# Patient Record
Sex: Female | Born: 1970 | Race: Black or African American | Hispanic: No | Marital: Single | Smoking: Current every day smoker
Health system: Southern US, Community
[De-identification: ages and names within clinical notes are randomized; demographics above are authoritative.]

## PROBLEM LIST (undated history)

## (undated) DIAGNOSIS — I10 Essential (primary) hypertension: Secondary | ICD-10-CM

## (undated) HISTORY — PX: FINGER SURGERY: SHX640

---

## 2002-11-09 ENCOUNTER — Emergency Department (HOSPITAL_COMMUNITY): Admission: EM | Admit: 2002-11-09 | Discharge: 2002-11-09 | Payer: Self-pay | Admitting: *Deleted

## 2006-10-21 ENCOUNTER — Emergency Department (HOSPITAL_COMMUNITY): Admission: EM | Admit: 2006-10-21 | Discharge: 2006-10-21 | Payer: Self-pay | Admitting: Emergency Medicine

## 2007-08-19 ENCOUNTER — Emergency Department (HOSPITAL_COMMUNITY): Admission: EM | Admit: 2007-08-19 | Discharge: 2007-08-19 | Payer: Self-pay | Admitting: Emergency Medicine

## 2007-08-21 ENCOUNTER — Emergency Department (HOSPITAL_COMMUNITY): Admission: EM | Admit: 2007-08-21 | Discharge: 2007-08-21 | Payer: Self-pay | Admitting: Emergency Medicine

## 2007-08-23 ENCOUNTER — Emergency Department (HOSPITAL_COMMUNITY): Admission: EM | Admit: 2007-08-23 | Discharge: 2007-08-23 | Payer: Self-pay | Admitting: Emergency Medicine

## 2007-12-12 ENCOUNTER — Emergency Department (HOSPITAL_COMMUNITY): Admission: EM | Admit: 2007-12-12 | Discharge: 2007-12-12 | Payer: Self-pay | Admitting: Emergency Medicine

## 2008-01-17 ENCOUNTER — Emergency Department (HOSPITAL_COMMUNITY): Admission: EM | Admit: 2008-01-17 | Discharge: 2008-01-17 | Payer: Self-pay | Admitting: Emergency Medicine

## 2008-02-03 ENCOUNTER — Emergency Department (HOSPITAL_COMMUNITY): Admission: EM | Admit: 2008-02-03 | Discharge: 2008-02-03 | Payer: Self-pay | Admitting: Emergency Medicine

## 2008-03-01 ENCOUNTER — Emergency Department (HOSPITAL_COMMUNITY): Admission: EM | Admit: 2008-03-01 | Discharge: 2008-03-01 | Payer: Self-pay | Admitting: Emergency Medicine

## 2008-03-26 ENCOUNTER — Emergency Department (HOSPITAL_COMMUNITY): Admission: EM | Admit: 2008-03-26 | Discharge: 2008-03-26 | Payer: Self-pay | Admitting: Emergency Medicine

## 2009-09-09 ENCOUNTER — Emergency Department (HOSPITAL_COMMUNITY): Admission: EM | Admit: 2009-09-09 | Discharge: 2009-09-09 | Payer: Self-pay | Admitting: Emergency Medicine

## 2009-10-25 ENCOUNTER — Emergency Department (HOSPITAL_COMMUNITY): Admission: EM | Admit: 2009-10-25 | Discharge: 2009-10-25 | Payer: Self-pay | Admitting: Gastroenterology

## 2010-03-21 ENCOUNTER — Emergency Department (HOSPITAL_COMMUNITY): Admission: EM | Admit: 2010-03-21 | Discharge: 2010-03-21 | Payer: Self-pay | Admitting: Emergency Medicine

## 2010-04-17 ENCOUNTER — Emergency Department (HOSPITAL_COMMUNITY): Admission: EM | Admit: 2010-04-17 | Discharge: 2010-04-17 | Payer: Self-pay | Admitting: Emergency Medicine

## 2010-08-26 ENCOUNTER — Emergency Department (HOSPITAL_COMMUNITY)
Admission: EM | Admit: 2010-08-26 | Discharge: 2010-08-26 | Disposition: A | Discharge status: Home / Self Care | Payer: Self-pay | Attending: Emergency Medicine | Admitting: Emergency Medicine

## 2010-08-26 ENCOUNTER — Emergency Department (HOSPITAL_COMMUNITY): Payer: Self-pay

## 2010-08-26 DIAGNOSIS — M1710 Unilateral primary osteoarthritis, unspecified knee: Secondary | ICD-10-CM | POA: Insufficient documentation

## 2010-08-26 DIAGNOSIS — M7660 Achilles tendinitis, unspecified leg: Secondary | ICD-10-CM | POA: Insufficient documentation

## 2010-08-26 DIAGNOSIS — IMO0002 Reserved for concepts with insufficient information to code with codable children: Secondary | ICD-10-CM | POA: Insufficient documentation

## 2010-09-04 ENCOUNTER — Emergency Department (HOSPITAL_COMMUNITY)
Admission: EM | Admit: 2010-09-04 | Discharge: 2010-09-04 | Disposition: A | Discharge status: Home / Self Care | Payer: Self-pay | Attending: Emergency Medicine | Admitting: Emergency Medicine

## 2010-09-04 ENCOUNTER — Emergency Department (HOSPITAL_COMMUNITY): Payer: Self-pay

## 2010-09-04 DIAGNOSIS — X503XXA Overexertion from repetitive movements, initial encounter: Secondary | ICD-10-CM | POA: Insufficient documentation

## 2010-09-04 DIAGNOSIS — Y92009 Unspecified place in unspecified non-institutional (private) residence as the place of occurrence of the external cause: Secondary | ICD-10-CM | POA: Insufficient documentation

## 2010-09-04 DIAGNOSIS — M25519 Pain in unspecified shoulder: Secondary | ICD-10-CM | POA: Insufficient documentation

## 2010-09-04 DIAGNOSIS — Y93H2 Activity, gardening and landscaping: Secondary | ICD-10-CM | POA: Insufficient documentation

## 2010-09-04 DIAGNOSIS — S46909A Unspecified injury of unspecified muscle, fascia and tendon at shoulder and upper arm level, unspecified arm, initial encounter: Secondary | ICD-10-CM | POA: Insufficient documentation

## 2010-09-04 DIAGNOSIS — S4980XA Other specified injuries of shoulder and upper arm, unspecified arm, initial encounter: Secondary | ICD-10-CM | POA: Insufficient documentation

## 2010-11-17 ENCOUNTER — Emergency Department (HOSPITAL_COMMUNITY): Payer: Self-pay

## 2010-11-17 ENCOUNTER — Emergency Department (HOSPITAL_COMMUNITY)
Admission: EM | Admit: 2010-11-17 | Discharge: 2010-11-17 | Disposition: A | Discharge status: Home / Self Care | Payer: Self-pay | Attending: Emergency Medicine | Admitting: Emergency Medicine

## 2010-11-17 DIAGNOSIS — W19XXXA Unspecified fall, initial encounter: Secondary | ICD-10-CM | POA: Insufficient documentation

## 2010-11-17 DIAGNOSIS — S93409A Sprain of unspecified ligament of unspecified ankle, initial encounter: Secondary | ICD-10-CM | POA: Insufficient documentation

## 2010-11-17 DIAGNOSIS — G43909 Migraine, unspecified, not intractable, without status migrainosus: Secondary | ICD-10-CM | POA: Insufficient documentation

## 2011-01-29 ENCOUNTER — Emergency Department (HOSPITAL_COMMUNITY): Payer: Self-pay

## 2011-01-29 ENCOUNTER — Emergency Department (HOSPITAL_COMMUNITY)
Admission: EM | Admit: 2011-01-29 | Discharge: 2011-01-29 | Disposition: A | Discharge status: Home / Self Care | Payer: Self-pay | Attending: Emergency Medicine | Admitting: Emergency Medicine

## 2011-01-29 ENCOUNTER — Encounter: Payer: Self-pay | Admitting: *Deleted

## 2011-01-29 DIAGNOSIS — F17200 Nicotine dependence, unspecified, uncomplicated: Secondary | ICD-10-CM | POA: Insufficient documentation

## 2011-01-29 DIAGNOSIS — Y92009 Unspecified place in unspecified non-institutional (private) residence as the place of occurrence of the external cause: Secondary | ICD-10-CM | POA: Insufficient documentation

## 2011-01-29 DIAGNOSIS — W108XXA Fall (on) (from) other stairs and steps, initial encounter: Secondary | ICD-10-CM | POA: Insufficient documentation

## 2011-01-29 DIAGNOSIS — S93409A Sprain of unspecified ligament of unspecified ankle, initial encounter: Secondary | ICD-10-CM | POA: Insufficient documentation

## 2011-01-29 MED ORDER — IBUPROFEN 800 MG PO TABS
ORAL_TABLET | ORAL | Status: AC
  Administered 2011-01-29: 800 mg via ORAL
  Filled 2011-01-29: qty 1

## 2011-01-29 MED ORDER — ACETAMINOPHEN-CODEINE #3 300-30 MG PO TABS: 1.0000 | ORAL_TABLET | Freq: Four times a day (QID) | ORAL | Status: AC | PRN

## 2011-01-29 NOTE — ED Notes (Signed)
C/o left ankle pain and swelling after slipping on steps today-states twisted ankle and felt pop; mild swelling noted to left ankle laterally; +3 left pedal pulsed palpated; in no distress; awaiting EDPA assessment

## 2011-01-29 NOTE — ED Notes (Signed)
ASO applied to left ankle; ice pack given; instructed to wear splint for compression and support, and to elevate as much as possible and apply ice pack at home to help alleviate swelling/pain.

## 2011-01-29 NOTE — ED Notes (Signed)
Pt states left ankle pain after a fall down steps ~ 1 hour PTA. Swelling to ankle.

## 2011-01-29 NOTE — Progress Notes (Signed)
Plan discussed. Crutches offered. Pt declined offer at this time.

## 2011-01-29 NOTE — ED Provider Notes (Signed)
History     Chief Complaint  Patient presents with  . Ankle Pain   Patient is a 40 y.o. female presenting with ankle pain. The history is provided by the patient.  Ankle Pain  The incident occurred 3 to 5 hours ago. The incident occurred at home. The injury mechanism was a fall. The pain is present in the left ankle. The quality of the pain is described as throbbing. The pain is moderate. The pain has been constant since onset. Associated symptoms include inability to bear weight. Pertinent negatives include no numbness. She reports no foreign bodies present. The symptoms are aggravated by bearing weight. She has tried nothing for the symptoms.    History reviewed. No pertinent past medical history.  Past Surgical History  Procedure Date  . Finger surgery     No family history on file.  History  Substance Use Topics  . Smoking status: Current Everyday Smoker  . Smokeless tobacco: Not on file  . Alcohol Use: Yes     OCC    OB History    Grav Para Term Preterm Abortions TAB SAB Ect Mult Living                  Review of Systems  Constitutional: Negative for activity change.       All ROS Neg except as noted in HPI  HENT: Negative for nosebleeds and neck pain.   Eyes: Negative for photophobia and discharge.  Respiratory: Negative for cough, shortness of breath and wheezing.   Cardiovascular: Negative for chest pain and palpitations.  Gastrointestinal: Negative for abdominal pain and blood in stool.  Genitourinary: Negative for dysuria, frequency and hematuria.  Musculoskeletal: Negative for back pain and arthralgias.  Skin: Negative.   Neurological: Negative for dizziness, seizures, speech difficulty and numbness.  Psychiatric/Behavioral: Negative for hallucinations and confusion.    Physical Exam  BP 128/90  Pulse 82  Temp(Src) 97.4 F (36.3 C) (Oral)  Resp 18  Ht 5\' 3"  (1.6 m)  Wt 170 lb (77.111 kg)  BMI 30.11 kg/m2  SpO2 100%  LMP 01/22/2011  Physical  Exam  Nursing note and vitals reviewed. Constitutional: She is oriented to person, place, and time. She appears well-developed and well-nourished.  Non-toxic appearance.  HENT:  Head: Normocephalic.  Right Ear: Tympanic membrane and external ear normal.  Left Ear: Tympanic membrane and external ear normal.  Eyes: EOM and lids are normal. Pupils are equal, round, and reactive to light.  Neck: Normal range of motion. Neck supple. Carotid bruit is not present.  Cardiovascular: Normal rate, regular rhythm, normal heart sounds, intact distal pulses and normal pulses.   Pulmonary/Chest: Breath sounds normal. No respiratory distress.  Abdominal: Soft. Bowel sounds are normal. There is no tenderness. There is no guarding.  Musculoskeletal: She exhibits tenderness.       Lt medial malleolus pain. Minimal swelling noted. DP pulse symmetrical. Achilles tendon intact.  Lymphadenopathy:       Head (right side): No submandibular adenopathy present.       Head (left side): No submandibular adenopathy present.    She has no cervical adenopathy.  Neurological: She is alert and oriented to person, place, and time. She has normal strength. No cranial nerve deficit or sensory deficit.  Skin: Skin is warm and dry.  Psychiatric: She has a normal mood and affect. Her speech is normal.    ED Course  Procedures  MDM I have reviewed nursing notes, vital signs, and all appropriate lab  and imaging results for this patient.      Kathie Dike, [place] 01/29/11 1610

## 2011-01-29 NOTE — Progress Notes (Signed)
  Medical screening examination/treatment/procedure(s) were performed by non-physician practitioner and as supervising physician I was immediately available for consultation/collaboration.     

## 2011-01-29 NOTE — ED Provider Notes (Signed)
Medical screening examination/treatment/procedure(s) were performed by non-physician practitioner and as supervising physician I was immediately available for consultation/collaboration.   Benny Lennert, MD 01/29/11 [phone]

## 2011-10-31 ENCOUNTER — Encounter (HOSPITAL_COMMUNITY): Payer: Self-pay | Admitting: *Deleted

## 2011-10-31 ENCOUNTER — Emergency Department (HOSPITAL_COMMUNITY): Payer: Self-pay

## 2011-10-31 ENCOUNTER — Emergency Department (HOSPITAL_COMMUNITY)
Admission: EM | Admit: 2011-10-31 | Discharge: 2011-10-31 | Disposition: A | Discharge status: Home / Self Care | Payer: Self-pay | Attending: Emergency Medicine | Admitting: Emergency Medicine

## 2011-10-31 DIAGNOSIS — M25519 Pain in unspecified shoulder: Secondary | ICD-10-CM | POA: Insufficient documentation

## 2011-10-31 DIAGNOSIS — S46319A Strain of muscle, fascia and tendon of triceps, unspecified arm, initial encounter: Secondary | ICD-10-CM

## 2011-10-31 DIAGNOSIS — X500XXA Overexertion from strenuous movement or load, initial encounter: Secondary | ICD-10-CM | POA: Insufficient documentation

## 2011-10-31 DIAGNOSIS — F17200 Nicotine dependence, unspecified, uncomplicated: Secondary | ICD-10-CM | POA: Insufficient documentation

## 2011-10-31 DIAGNOSIS — S43499A Other sprain of unspecified shoulder joint, initial encounter: Secondary | ICD-10-CM | POA: Insufficient documentation

## 2011-10-31 MED ORDER — ONDANSETRON HCL 4 MG PO TABS
4.0000 mg | ORAL_TABLET | Freq: Once | ORAL | Status: AC
  Administered 2011-10-31: 4 mg via ORAL
  Filled 2011-10-31: qty 1

## 2011-10-31 MED ORDER — IBUPROFEN 800 MG PO TABS: 800.0000 mg | ORAL_TABLET | Freq: Three times a day (TID) | ORAL | Status: AC

## 2011-10-31 MED ORDER — OXYCODONE-ACETAMINOPHEN 5-325 MG PO TABS
1.0000 | ORAL_TABLET | Freq: Once | ORAL | Status: AC
  Administered 2011-10-31: 1 via ORAL
  Filled 2011-10-31: qty 1

## 2011-10-31 MED ORDER — OXYCODONE-ACETAMINOPHEN 5-325 MG PO TABS: 1.0000 | ORAL_TABLET | Freq: Four times a day (QID) | ORAL | Status: AC | PRN

## 2011-10-31 MED ORDER — IBUPROFEN 800 MG PO TABS
800.0000 mg | ORAL_TABLET | Freq: Once | ORAL | Status: AC
  Administered 2011-10-31: 800 mg via ORAL
  Filled 2011-10-31: qty 1

## 2011-10-31 NOTE — Discharge Instructions (Signed)
Your have a strain of the muscles of the deltoid area. Please rest the right arm as much as possible. Ibuprofen three times daily with food. If pain continues after 7 to 8 days, please see Dr Romeo Apple for orthopedic evaluation. Use percocet for more severe pain. This may cause drowsiness and or constipation, use with caution.Muscle Strain A muscle strain (pulled muscle) happens when a muscle is over-stretched. Recovery usually takes 5 to 6 weeks.  HOME CARE   Put ice on the injured area.   Put ice in a plastic bag.   Place a towel between your skin and the bag.   Leave the ice on for 15 to 20 minutes at a time, every hour for the first 2 days.   Do not use the muscle for several days or until your doctor says you can. Do not use the muscle if you have pain.   Wrap the injured area with an elastic bandage for comfort. Do not put it on too tightly.   Only take medicine as told by your doctor.   Warm up before exercise. This helps prevent muscle strains.  GET HELP RIGHT AWAY IF:  There is increased pain or puffiness (swelling) in the affected area. MAKE SURE YOU:   Understand these instructions.   Will watch your condition.   Will get help right away if you are not doing well or get worse.  Document Released: 03/30/2008 Document Revised: 06/10/2011 Document Reviewed: 03/30/2008 [employer] Patient Information 2012 [place], [place].

## 2011-10-31 NOTE — ED Notes (Signed)
Pt a/ox4. Resp even and unlabored. NAD at this time. D/ C instructions reviewed with pt. Pt verbalized understanding. Pt ambulated to d/c desk with steady gate.  

## 2011-10-31 NOTE — ED Notes (Signed)
Pt presents with pain to left shoulder after lifting family member up. Pt has full ROM but is painful to move. Sensation is intact. Pulse palp. Skin WDI. NAD at this time. Will continue to monitor.

## 2011-10-31 NOTE — ED Notes (Signed)
Shoulder Immobilizer applied to right arm.

## 2011-10-31 NOTE — ED Provider Notes (Signed)
History     CSN: [id number]  Arrival date & time 10/31/11  1210   First MD Initiated Contact with Patient 10/31/11 1231      Chief Complaint  Patient presents with  . Shoulder Pain    (Consider location/radiation/quality/duration/timing/severity/associated sxs/prior treatment) Patient is a 41 y.o. female presenting with shoulder pain. The history is provided by the patient.  Shoulder Pain This is a new problem. The current episode started in the past 7 days. The problem occurs daily. The problem has been gradually worsening. Pertinent negatives include no abdominal pain, arthralgias, chest pain, coughing or neck pain.    History reviewed. No pertinent past medical history.  Past Surgical History  Procedure Date  . Finger surgery     No family history on file.  History  Substance Use Topics  . Smoking status: Current Everyday Smoker  . Smokeless tobacco: Not on file  . Alcohol Use: Yes     OCC    OB History    Grav Para Term Preterm Abortions TAB SAB Ect Mult Living                  Review of Systems  Constitutional: Negative for activity change.       All ROS Neg except as noted in HPI  HENT: Negative for nosebleeds and neck pain.   Eyes: Negative for photophobia and discharge.  Respiratory: Negative for cough, shortness of breath and wheezing.   Cardiovascular: Negative for chest pain and palpitations.  Gastrointestinal: Negative for abdominal pain and blood in stool.  Genitourinary: Negative for dysuria, frequency and hematuria.  Musculoskeletal: Negative for back pain and arthralgias.  Skin: Negative.   Neurological: Negative for dizziness, seizures and speech difficulty.  Psychiatric/Behavioral: Negative for hallucinations and confusion.    Allergies  Penicillins and Vancomycin  Home Medications   Current Outpatient Rx  Name Route Sig Dispense Refill  . IBUPROFEN 200 MG PO TABS Oral Take 400 mg by mouth every 6 (six) hours as needed. For headache      . IBUPROFEN 800 MG PO TABS Oral Take 1 tablet (800 mg total) by mouth 3 (three) times daily. 21 tablet 0    Take with food  . OXYCODONE-ACETAMINOPHEN 5-325 MG PO TABS Oral Take 1 tablet by mouth every 6 (six) hours as needed for pain. 20 tablet 0    BP 124/81  Pulse 98  Temp(Src) 98.1 F (36.7 C) (Oral)  Resp 16  Ht 5\' 3"  (1.6 m)  Wt 175 lb (79.379 kg)  BMI 31.00 kg/m2  SpO2 100%  LMP 10/14/2011  Physical Exam  Nursing note and vitals reviewed. Constitutional: She is oriented to person, place, and time. She appears well-developed and well-nourished.  Non-toxic appearance.  HENT:  Head: Normocephalic.  Right Ear: Tympanic membrane and external ear normal.  Left Ear: Tympanic membrane and external ear normal.  Eyes: EOM and lids are normal. Pupils are equal, round, and reactive to light.  Neck: Normal range of motion. Neck supple. Carotid bruit is not present.  Cardiovascular: Normal rate, regular rhythm, normal heart sounds, intact distal pulses and normal pulses.   Pulmonary/Chest: Breath sounds normal. No respiratory distress.  Abdominal: Soft. Bowel sounds are normal. There is no tenderness. There is no guarding.  Musculoskeletal: Normal range of motion.       Pain with attempted ROM of the left deltoid area. Not hot. No evidence for shoulder dislocation. Distal pulse and sensory wnl.  Lymphadenopathy:  Head (right side): No submandibular adenopathy present.       Head (left side): No submandibular adenopathy present.    She has no cervical adenopathy.  Neurological: She is alert and oriented to person, place, and time. She has normal strength. No cranial nerve deficit or sensory deficit.  Skin: Skin is warm and dry.  Psychiatric: She has a normal mood and affect. Her speech is normal.    ED Course  Procedures (including critical care time)  Labs Reviewed - No data to display Dg Shoulder Right  10/31/2011  *RADIOLOGY REPORT*  Clinical Data: Lifting injury with  right shoulder pain.  RIGHT SHOULDER - 2+ VIEW  Comparison: None.  Findings: No acute fracture identified.  There is some relative elevation of the distal clavicle compared to the acromion which may be new or old and reflective of AC joint sprain/separation. Acromial spur present inferolaterally.  No bony lesions or destruction.  IMPRESSION: Elevated distal clavicle relative to the acromion which may reflect new or old AC joint injury.  Acromial spur.  Original Report Authenticated By: Reola Calkins, M.D.     1. Strain of triceps muscle       MDM  I have reviewed nursing notes, vital signs, and all appropriate lab and imaging results for this patient. Pt was helping to lift a family member and injured the left shoulder nearly 1 week ago. xrays are negative for fx or dislocation. Pt referred to orthopedics. RX for ibuprofen and percocet given.       Kathie Dike, [place] 11/03/11 [phone]

## 2011-10-31 NOTE — ED Notes (Signed)
Pain to left shoulder x 1 week after lifting a family member.

## 2011-11-03 NOTE — ED Provider Notes (Signed)
Medical screening examination/treatment/procedure(s) were performed by non-physician practitioner and as supervising physician I was immediately available for consultation/collaboration.   Carleene Cooper III, MD 11/03/11 2010

## 2012-03-17 ENCOUNTER — Emergency Department (HOSPITAL_COMMUNITY): Payer: Self-pay

## 2012-03-17 ENCOUNTER — Encounter (HOSPITAL_COMMUNITY): Payer: Self-pay | Admitting: *Deleted

## 2012-03-17 ENCOUNTER — Emergency Department (HOSPITAL_COMMUNITY)
Admission: EM | Admit: 2012-03-17 | Discharge: 2012-03-17 | Disposition: A | Discharge status: Home / Self Care | Payer: Self-pay | Attending: Emergency Medicine | Admitting: Emergency Medicine

## 2012-03-17 DIAGNOSIS — M7660 Achilles tendinitis, unspecified leg: Secondary | ICD-10-CM | POA: Insufficient documentation

## 2012-03-17 DIAGNOSIS — F17200 Nicotine dependence, unspecified, uncomplicated: Secondary | ICD-10-CM | POA: Insufficient documentation

## 2012-03-17 MED ORDER — HYDROCODONE-ACETAMINOPHEN 5-325 MG PO TABS: 1.0000 | ORAL_TABLET | Freq: Four times a day (QID) | ORAL | Status: AC | PRN

## 2012-03-17 NOTE — ED Notes (Signed)
Pain lt post foot and radiates up.  No injury

## 2012-03-17 NOTE — ED Notes (Signed)
Pt reports left foot pain for several days denies any known injury.  Has had xray and been seen by pa --[employer]

## 2012-03-17 NOTE — ED Provider Notes (Signed)
History     CSN: [id number]  Arrival date & time 03/17/12  1457   First MD Initiated Contact with Patient 03/17/12 1756      Chief Complaint  Patient presents with  . Foot Pain    (Consider location/radiation/quality/duration/timing/severity/associated sxs/prior treatment) HPI Comments: Pt has no known injury.  She localizes the pain to her L heel and achilles tendon.  Pain with walking.  States she is unable to stand on her tiptoes.  No pre-existing problem with foot.  Patient is a 41 y.o. female presenting with lower extremity pain. The history is provided by the patient. No language interpreter was used.  Foot Pain This is a new problem. Episode onset: 3 days ago. The problem occurs constantly. The problem has been unchanged. Pertinent negatives include no chills or fever. The symptoms are aggravated by walking and standing. She has tried NSAIDs for the symptoms. The treatment provided no relief.    History reviewed. No pertinent past medical history.  Past Surgical History  Procedure Date  . Finger surgery     History reviewed. No pertinent family history.  History  Substance Use Topics  . Smoking status: Current Every Day Smoker  . Smokeless tobacco: Not on file  . Alcohol Use: Yes     OCC    OB History    Grav Para Term Preterm Abortions TAB SAB Ect Mult Living                  Review of Systems  Constitutional: Negative for fever and chills.  Musculoskeletal:       Heel pain     Allergies  Penicillins and Vancomycin  Home Medications   Current Outpatient Rx  Name Route Sig Dispense Refill  . HYDROCODONE-ACETAMINOPHEN 5-325 MG PO TABS Oral Take 1 tablet by mouth every 6 (six) hours as needed for pain. 20 tablet 0  . IBUPROFEN 200 MG PO TABS Oral Take 400 mg by mouth every 6 (six) hours as needed. For headache       BP 130/90  Pulse 78  Temp 98.7 F (37.1 C) (Oral)  Resp 18  Ht 5\' 3"  (1.6 m)  Wt 170 lb (77.111 kg)  BMI 30.11 kg/m2  LMP  02/13/2012  Physical Exam  Nursing note and vitals reviewed. Constitutional: She is oriented to person, place, and time. She appears well-developed and well-nourished. No distress.  HENT:  Head: Normocephalic and atraumatic.  Eyes: EOM are normal.  Neck: Normal range of motion.  Cardiovascular: Normal rate, regular rhythm and normal heart sounds.   Pulmonary/Chest: Effort normal and breath sounds normal.  Abdominal: Soft. She exhibits no distension. There is no tenderness.  Musculoskeletal: She exhibits tenderness.       Left ankle: She exhibits decreased range of motion. She exhibits no swelling, no ecchymosis, no deformity, no laceration and normal pulse. tenderness.       Feet:  Neurological: She is alert and oriented to person, place, and time.  Skin: Skin is warm and dry.  Psychiatric: She has a normal mood and affect. Judgment normal.    ED Course  Procedures (including critical care time)  Labs Reviewed - No data to display Dg Foot Complete Left  03/17/2012  *RADIOLOGY REPORT*  Clinical Data: Pain  LEFT FOOT - COMPLETE 3+ VIEW  Comparison: None.  Findings: There is no evidence of acute fracture.  The forefoot appears normal.  There is midfoot degenerative change with prominent osteophytes at the talonavicular articulation and the navicular  cuneiform articulations.  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  No acute finding at the heel.  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   Original Report Authenticated By: Thomasenia Sales, M.D.      1. Achilles tendonitis       MDM  Posterior splint in plantar flexion, crutches and non-wt bearing. Ice Ibuprofen rx-hydrocodone, 20 F/u with dr. Hilda Lias.        Evalina Field, [place] 03/17/12 1819

## 2012-03-18 NOTE — ED Provider Notes (Signed)
Medical screening examination/treatment/procedure(s) were performed by non-physician practitioner and as supervising physician I was immediately available for consultation/collaboration.  [name], MD 03/18/12 1456 

## 2012-05-12 IMAGING — CR DG SHOULDER 2+V*R*
3 series · 3 of 3 positions shown · non-contrast
Comparison: None.

CLINICAL DATA: Lifting injury with right shoulder pain.

RIGHT SHOULDER - 2+ VIEW

[view not recorded (1 of 3)]
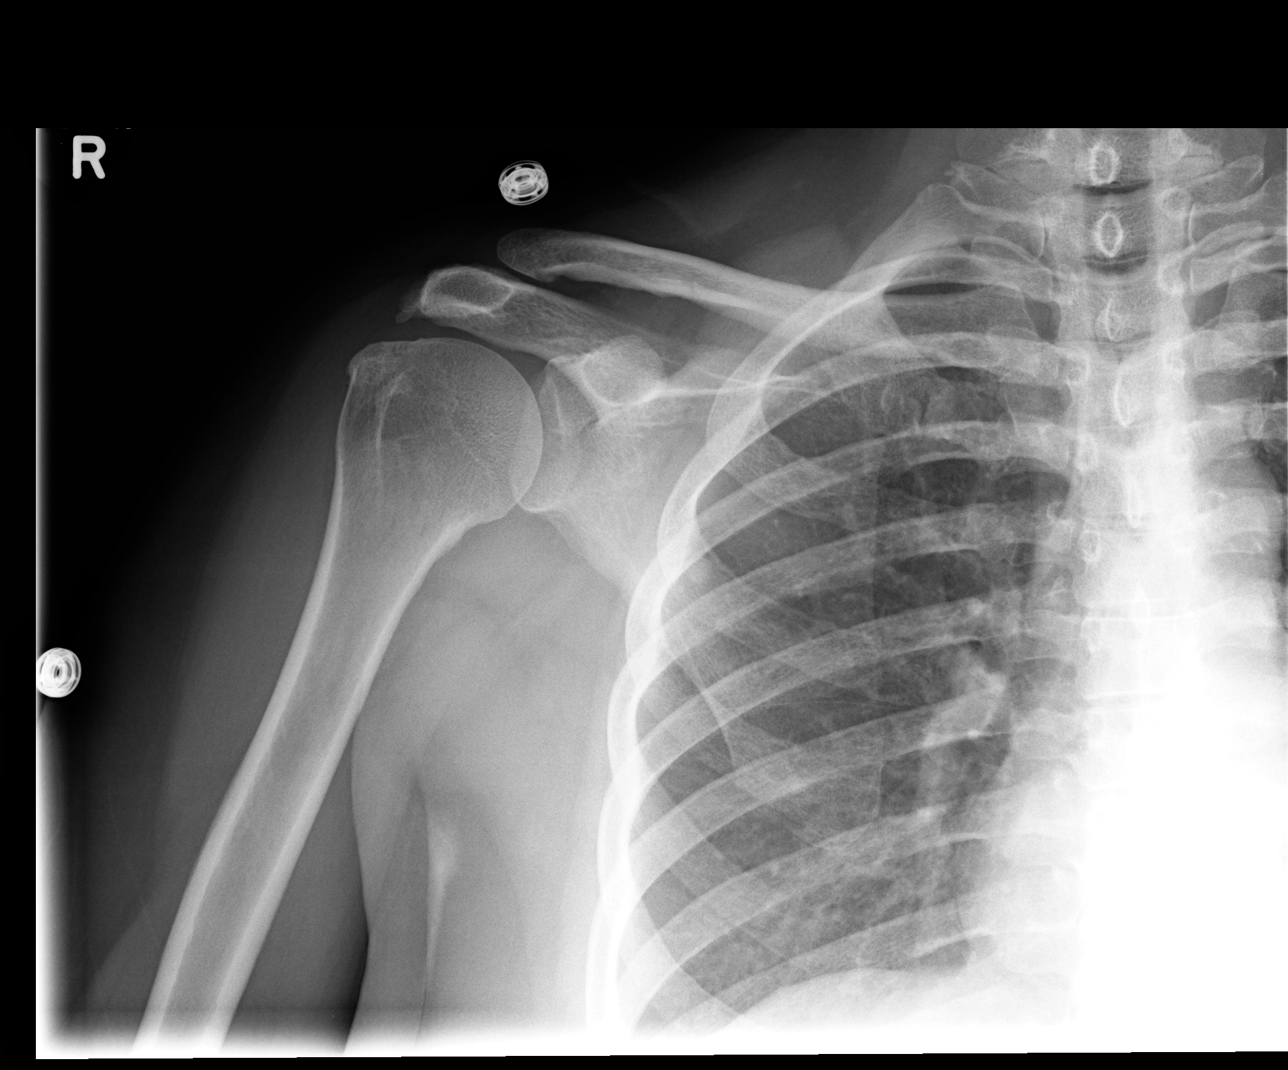

[view not recorded (2 of 3)]
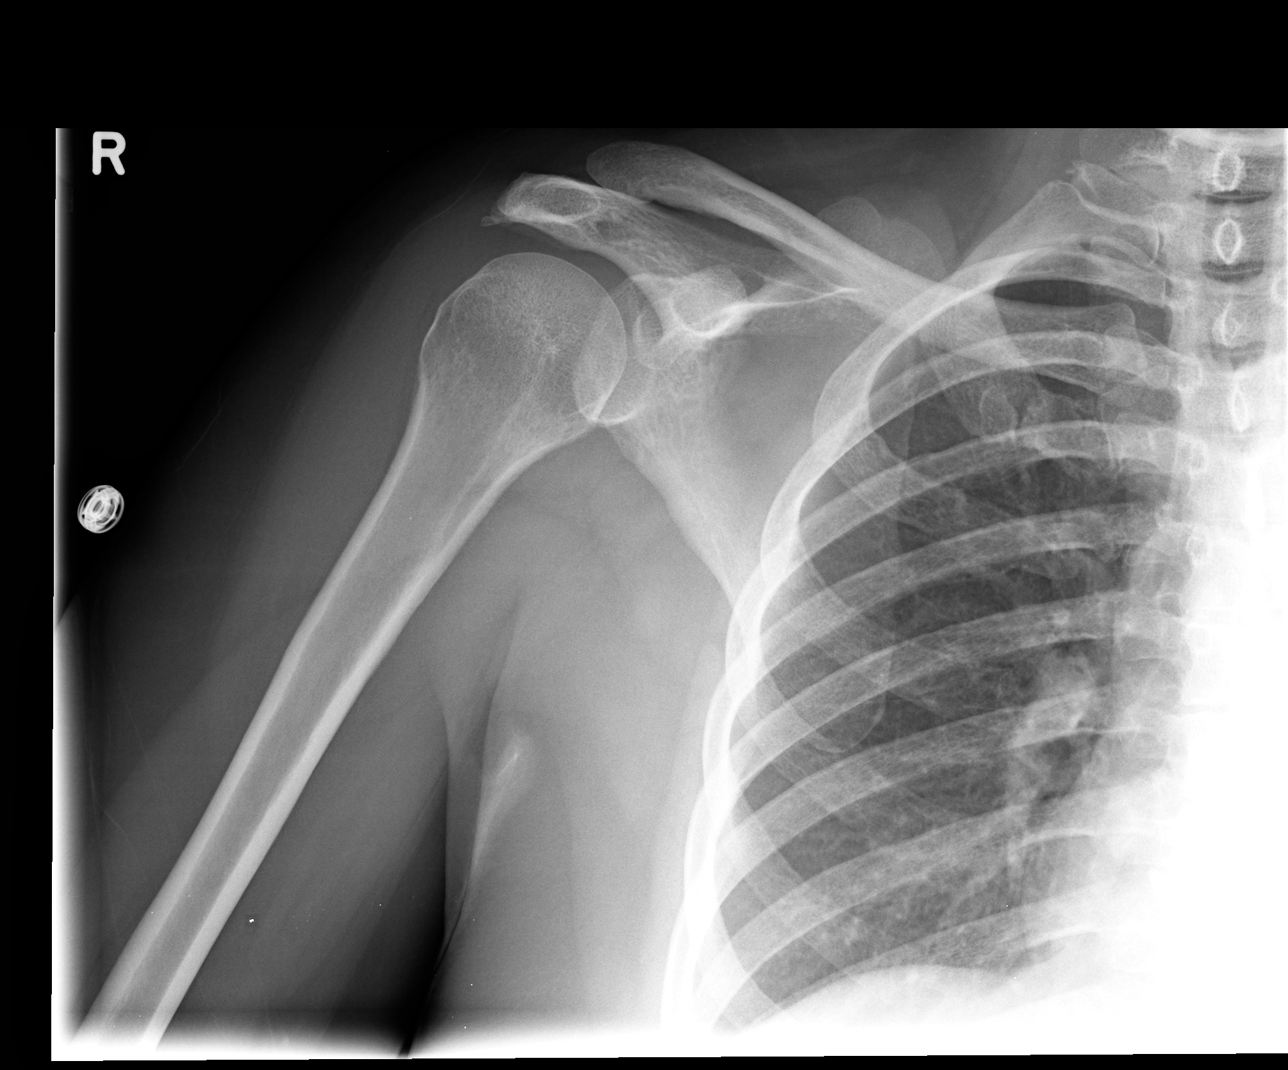

[view not recorded (3 of 3)]
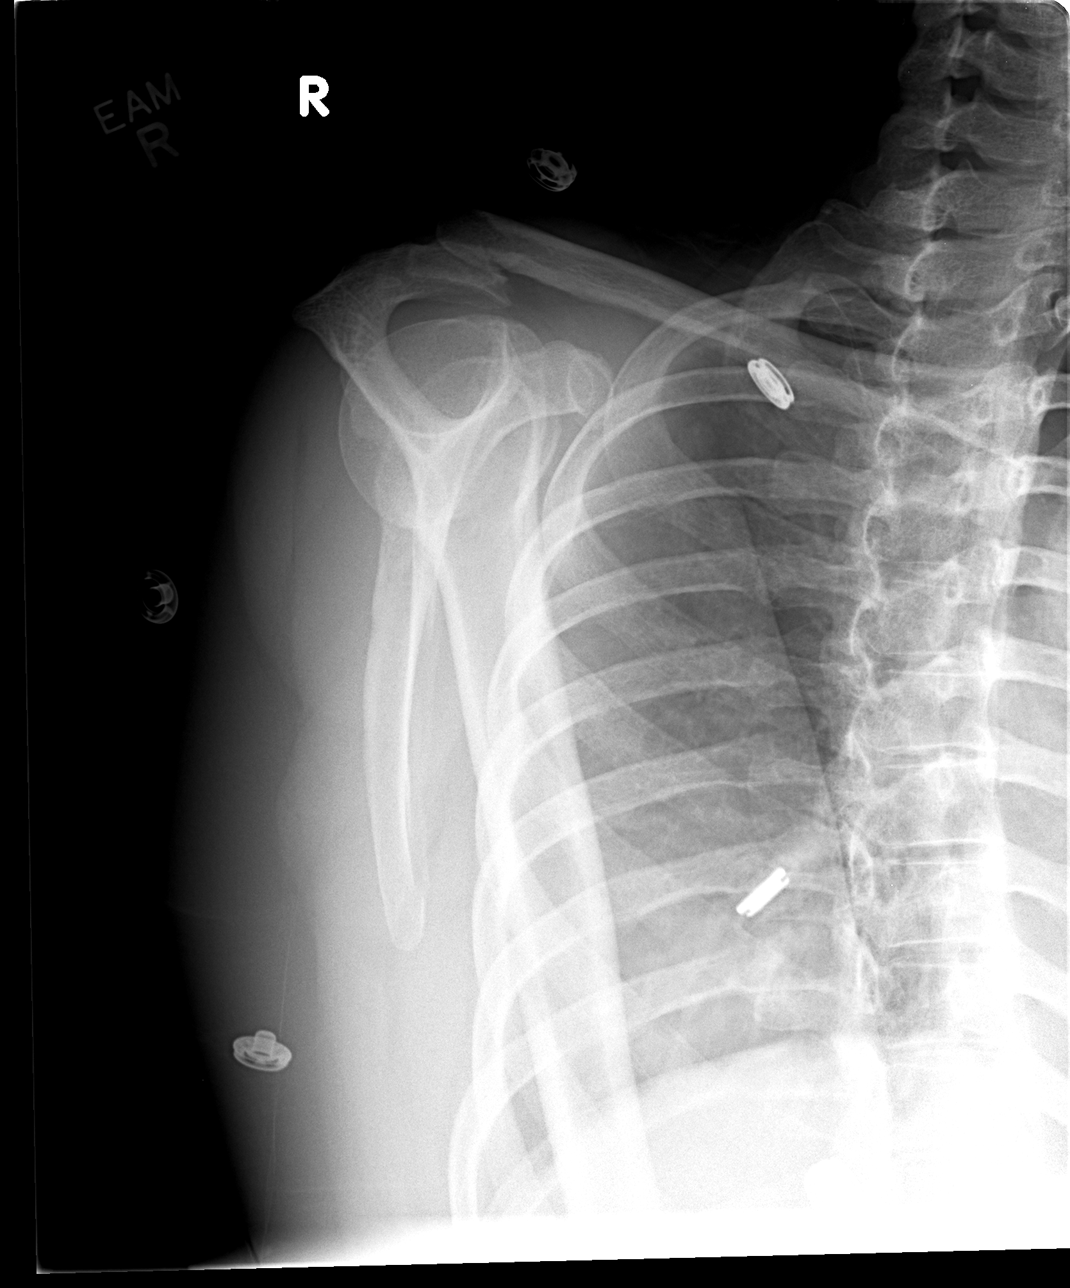

[3 of 3 positions shown; findings below may reference images not displayed]

FINDINGS: No acute fracture identified.  There is some relative
elevation of the distal clavicle compared to the acromion which may
be new or old and reflective of AC joint sprain/separation.
Acromial spur present inferolaterally.  No bony lesions or
destruction.
IMPRESSION: Elevated distal clavicle relative to the acromion which may reflect
new or old AC joint injury.  Acromial spur.

## 2012-05-25 ENCOUNTER — Emergency Department (HOSPITAL_COMMUNITY)
Admission: EM | Admit: 2012-05-25 | Discharge: 2012-05-25 | Disposition: A | Discharge status: Home / Self Care | Payer: Self-pay | Attending: Emergency Medicine | Admitting: Emergency Medicine

## 2012-05-25 ENCOUNTER — Encounter (HOSPITAL_COMMUNITY): Payer: Self-pay | Admitting: *Deleted

## 2012-05-25 DIAGNOSIS — Y9389 Activity, other specified: Secondary | ICD-10-CM | POA: Insufficient documentation

## 2012-05-25 DIAGNOSIS — M549 Dorsalgia, unspecified: Secondary | ICD-10-CM

## 2012-05-25 DIAGNOSIS — Y929 Unspecified place or not applicable: Secondary | ICD-10-CM | POA: Insufficient documentation

## 2012-05-25 DIAGNOSIS — F17200 Nicotine dependence, unspecified, uncomplicated: Secondary | ICD-10-CM | POA: Insufficient documentation

## 2012-05-25 DIAGNOSIS — I10 Essential (primary) hypertension: Secondary | ICD-10-CM | POA: Insufficient documentation

## 2012-05-25 DIAGNOSIS — IMO0002 Reserved for concepts with insufficient information to code with codable children: Secondary | ICD-10-CM | POA: Insufficient documentation

## 2012-05-25 DIAGNOSIS — S3981XA Other specified injuries of abdomen, initial encounter: Secondary | ICD-10-CM | POA: Insufficient documentation

## 2012-05-25 DIAGNOSIS — Z3202 Encounter for pregnancy test, result negative: Secondary | ICD-10-CM | POA: Insufficient documentation

## 2012-05-25 DIAGNOSIS — X503XXA Overexertion from repetitive movements, initial encounter: Secondary | ICD-10-CM | POA: Insufficient documentation

## 2012-05-25 HISTORY — DX: Essential (primary) hypertension: I10

## 2012-05-25 LAB — URINALYSIS, ROUTINE W REFLEX MICROSCOPIC
Bilirubin Urine: NEGATIVE
Glucose, UA: NEGATIVE mg/dL
Ketones, ur: NEGATIVE mg/dL
Protein, ur: NEGATIVE mg/dL
pH: 6 (ref 5.0–8.0)

## 2012-05-25 MED ORDER — OXYCODONE-ACETAMINOPHEN 5-325 MG PO TABS: 1.0000 | ORAL_TABLET | Freq: Three times a day (TID) | ORAL | Status: DC | PRN

## 2012-05-25 NOTE — ED Notes (Signed)
L Flank pain 10/10, radiating down L side to pelvis x 3 days which began after lifting boxes.  Pain had insidious onset and has been progressive.  She has been taking NSAIDs and Tylenol w/out relief.  Unable to sleep d/t pain.   No c/o dysuria, hematuria, SOB. Pain escalates w/L spine rotation and w/light palpation.

## 2012-05-25 NOTE — ED Notes (Signed)
Pt with left flank pain x 3 days, denies blood in urine

## 2012-05-25 NOTE — ED Provider Notes (Signed)
History   This chart was scribed for Joya Gaskins, MD by Gerlean Ren, ED Scribe. This patient was seen in room [facility] and the patient's care was started at 11:47 AM    CSN: [id number]  Arrival date & time 05/25/12  1100   First MD Initiated Contact with Patient 05/25/12 1123      Chief Complaint  Patient presents with  . Flank Pain     The history is provided by the patient. No language interpreter was used.   Mikayla Johnson is a 41 y.o. female who presents to the Emergency Department complaining of constant, non-radiating, aching left flank pain that is not improved by OTC with gradual onset 3 days ago after moving furniture.  Pt denies hematuria, dysuria, frequency, urgency, fever, emesis, diarrhea, abdominal pain, cough, dyspnea, chest pain, vaginal symptoms, vaginal bleeding, weakness or numbness in legs, or falls as associated.  Pt has h/o HTN.  Pt is a current everyday smoker and reports occasional alcohol use.  Past Medical History  Diagnosis Date  . Hypertension     Past Surgical History  Procedure Date  . Finger surgery     History reviewed. No pertinent family history.  History  Substance Use Topics  . Smoking status: Current Every Day Smoker  . Smokeless tobacco: Not on file  . Alcohol Use: Yes     Comment: OCC    No OB history provided.  Review of Systems  Constitutional: Negative for fever.  Respiratory: Negative for cough and shortness of breath.   Cardiovascular: Negative for chest pain.  Gastrointestinal: Negative for nausea, vomiting, abdominal pain and diarrhea.  Genitourinary: Positive for flank pain. Negative for dysuria, urgency, frequency, hematuria, vaginal bleeding, vaginal discharge and vaginal pain.  Neurological: Negative for weakness and numbness.  All other systems reviewed and are negative.    Allergies  Penicillins and Vancomycin  Home Medications   Current Outpatient Rx  Name  Route  Sig  Dispense  Refill  .  ACETAMINOPHEN 500 MG PO TABS   Oral   Take 1,000 mg by mouth every 6 (six) hours as needed. Pain.         . IBUPROFEN 200 MG PO TABS   Oral   Take 400 mg by mouth every 6 (six) hours as needed. For headache          . NAPROXEN SODIUM 220 MG PO TABS   Oral   Take 440 mg by mouth 2 (two) times daily as needed. Pain.           BP 137/88  Pulse 90  Temp 98 F (36.7 C) (Oral)  Resp 20  Ht 5\' 3"  (1.6 m)  Wt 170 lb (77.111 kg)  BMI 30.11 kg/m2  SpO2 100%  LMP 04/28/2012  Physical Exam CONSTITUTIONAL: Well developed/well nourished HEAD AND FACE: Normocephalic/atraumatic EYES: EOMI/PERRL ENMT: Mucous membranes moist NECK: supple no meningeal signs SPINE:entire spine nontender, No bruising/crepitance/stepoffs noted to spine CV: S1/S2 noted, no murmurs/rubs/gallops noted LUNGS: Lungs are clear to auscultation bilaterally, no apparent distress ABDOMEN: soft, nontender, no rebound or guarding MS: tenderness over left flank GU:no cva tenderness NEURO: Pt is awake/alert, moves all extremitiesx4, no focal motor deficits EXTREMITIES: pulses normal, full ROM SKIN: warm, color normal PSYCH: no abnormalities of mood noted  ED Course  Procedures  DIAGNOSTIC STUDIES: Oxygen Saturation is 100% on room air, normal by my interpretation.    COORDINATION OF CARE: 11:51 AM- Patient informed of clinical course, understands medical decision-making process,  and agrees with plan.  Ordered percocet at discharge.    Results for orders placed during the hospital encounter of 05/25/12  URINALYSIS, ROUTINE W REFLEX MICROSCOPIC      Component Value Range   Color, Urine YELLOW  YELLOW   APPearance CLEAR  CLEAR   Specific Gravity, Urine >1.030 (*) 1.005 - 1.030   pH 6.0  5.0 - 8.0   Glucose, UA NEGATIVE  NEGATIVE mg/dL   Hgb urine dipstick MODERATE (*) NEGATIVE   Bilirubin Urine NEGATIVE  NEGATIVE   Ketones, ur NEGATIVE  NEGATIVE mg/dL   Protein, ur NEGATIVE  NEGATIVE mg/dL    Urobilinogen, UA 0.2  0.0 - 1.0 mg/dL   Nitrite NEGATIVE  NEGATIVE   Leukocytes, UA NEGATIVE  NEGATIVE  POCT PREGNANCY, URINE      Component Value Range   Preg Test, Ur NEGATIVE  NEGATIVE  URINE MICROSCOPIC-ADD ON      Component Value Range   RBC / HPF 3-6  <3 RBC/hpf   Pt did have hematuria, but store more c/w MSK pain rather than ureterolithiasis.   Stable for d/c.  She is aware of hematuria, could have small kidney stone but pain likely due to recent moving furniture   MDM  Nursing notes including past medical history and social history reviewed and considered in documentation Labs/vital reviewed and considered    I personally performed the services described in this documentation, which was scribed in my presence. The recorded information has been reviewed and is accurate.           Joya Gaskins, MD 05/25/12 [phone]

## 2013-09-24 ENCOUNTER — Emergency Department (HOSPITAL_COMMUNITY)
Admission: EM | Admit: 2013-09-24 | Discharge: 2013-09-24 | Disposition: A | Discharge status: Home / Self Care | Payer: Self-pay | Attending: Emergency Medicine | Admitting: Emergency Medicine

## 2013-09-24 ENCOUNTER — Encounter (HOSPITAL_COMMUNITY): Payer: Self-pay | Admitting: Emergency Medicine

## 2013-09-24 ENCOUNTER — Emergency Department (HOSPITAL_COMMUNITY): Payer: Self-pay

## 2013-09-24 DIAGNOSIS — I10 Essential (primary) hypertension: Secondary | ICD-10-CM | POA: Insufficient documentation

## 2013-09-24 DIAGNOSIS — R079 Chest pain, unspecified: Secondary | ICD-10-CM | POA: Insufficient documentation

## 2013-09-24 DIAGNOSIS — F17200 Nicotine dependence, unspecified, uncomplicated: Secondary | ICD-10-CM | POA: Insufficient documentation

## 2013-09-24 DIAGNOSIS — Z880 Allergy status to penicillin: Secondary | ICD-10-CM | POA: Insufficient documentation

## 2013-09-24 LAB — CBC WITH DIFFERENTIAL/PLATELET
Basophils Absolute: 0 K/uL (ref 0.0–0.1)
Basophils Relative: 1 % (ref 0–1)
Eosinophils Absolute: 0.1 K/uL (ref 0.0–0.7)
Eosinophils Relative: 1 % (ref 0–5)
HCT: 45.4 % (ref 36.0–46.0)
Hemoglobin: 15.3 g/dL — ABNORMAL HIGH (ref 12.0–15.0)
Lymphocytes Relative: 28 % (ref 12–46)
Lymphs Abs: 2.3 K/uL (ref 0.7–4.0)
MCH: 32.9 pg (ref 26.0–34.0)
MCHC: 33.7 g/dL (ref 30.0–36.0)
MCV: 97.6 fL (ref 78.0–100.0)
Monocytes Absolute: 0.5 K/uL (ref 0.1–1.0)
Monocytes Relative: 7 % (ref 3–12)
Neutro Abs: 5.2 K/uL (ref 1.7–7.7)
Neutrophils Relative %: 64 % (ref 43–77)
Platelets: 285 K/uL (ref 150–400)
RBC: 4.65 MIL/uL (ref 3.87–5.11)
RDW: 14 % (ref 11.5–15.5)
WBC: 8.2 K/uL (ref 4.0–10.5)

## 2013-09-24 LAB — BASIC METABOLIC PANEL WITH GFR
BUN: 12 mg/dL (ref 6–23)
CO2: 25 meq/L (ref 19–32)
Calcium: 9.1 mg/dL (ref 8.4–10.5)
Chloride: 102 meq/L (ref 96–112)
Creatinine, Ser: 0.79 mg/dL (ref 0.50–1.10)
GFR calc Af Amer: >90 mL/min
GFR calc non Af Amer: >90 mL/min
Glucose, Bld: 99 mg/dL (ref 70–99)
Potassium: 3.9 meq/L (ref 3.7–5.3)
Sodium: 138 meq/L (ref 137–147)

## 2013-09-24 LAB — TROPONIN I: Troponin I: <0.3 ng/mL

## 2013-09-24 NOTE — ED Notes (Signed)
Pt c/o intermittent cp/left side pain worse today.

## 2013-09-24 NOTE — Discharge Instructions (Signed)
Chest Pain (Nonspecific) °It is often hard to give a specific diagnosis for the cause of chest pain. There is always a chance that your pain could be related to something serious, such as a heart attack or a blood clot in the lungs. You need to follow up with your caregiver for further evaluation. °CAUSES  °· Heartburn. °· Pneumonia or bronchitis. °· Anxiety or stress. °· Inflammation around your heart (pericarditis) or lung (pleuritis or pleurisy). °· A blood clot in the lung. °· A collapsed lung (pneumothorax). It can develop suddenly on its own (spontaneous pneumothorax) or from injury (trauma) to the chest. °· Shingles infection (herpes zoster virus). °The chest wall is composed of bones, muscles, and cartilage. Any of these can be the source of the pain. °· The bones can be bruised by injury. °· The muscles or cartilage can be strained by coughing or overwork. °· The cartilage can be affected by inflammation and become sore (costochondritis). °DIAGNOSIS  °Lab tests or other studies, such as X-rays, electrocardiography, stress testing, or cardiac imaging, may be needed to find the cause of your pain.  °TREATMENT  °· Treatment depends on what may be causing your chest pain. Treatment may include: °· Acid blockers for heartburn. °· Anti-inflammatory medicine. °· Pain medicine for inflammatory conditions. °· Antibiotics if an infection is present. °· You may be advised to change lifestyle habits. This includes stopping smoking and avoiding alcohol, caffeine, and chocolate. °· You may be advised to keep your head raised (elevated) when sleeping. This reduces the chance of acid going backward from your stomach into your esophagus. °· Most of the time, nonspecific chest pain will improve within 2 to 3 days with rest and mild pain medicine. °HOME CARE INSTRUCTIONS  °· If antibiotics were prescribed, take your antibiotics as directed. Finish them even if you start to feel better. °· For the next few days, avoid physical  activities that bring on chest pain. Continue physical activities as directed. °· Do not smoke. °· Avoid drinking alcohol. °· Only take over-the-counter or prescription medicine for pain, discomfort, or fever as directed by your caregiver. °· Follow your caregiver's suggestions for further testing if your chest pain does not go away. °· Keep any follow-up appointments you made. If you do not go to an appointment, you could develop lasting (chronic) problems with pain. If there is any problem keeping an appointment, you must call to reschedule. °SEEK MEDICAL CARE IF:  °· You think you are having problems from the medicine you are taking. Read your medicine instructions carefully. °· Your chest pain does not go away, even after treatment. °· You develop a rash with blisters on your chest. °SEEK IMMEDIATE MEDICAL CARE IF:  °· You have increased chest pain or pain that spreads to your arm, neck, jaw, back, or abdomen. °· You develop shortness of breath, an increasing cough, or you are coughing up blood. °· You have severe back or abdominal pain, feel nauseous, or vomit. °· You develop severe weakness, fainting, or chills. °· You have a fever. °THIS IS AN EMERGENCY. Do not wait to see if the pain will go away. Get medical help at once. Call your local emergency services (911 in [place] Do not drive yourself to the hospital. °MAKE SURE YOU:  °· Understand these instructions. °· Will watch your condition. °· Will get help right away if you are not doing well or get worse. °Document Released: 03/31/2005 Document Revised: 09/13/2011 Document Reviewed: 01/25/2008 °ExitCare® Patient Information ©2014 [employer],   LLC. ° ° ° °Emergency Department Resource Guide °1) Find a Doctor and Pay Out of Pocket °Although you won't have to find out who is covered by your insurance plan, it is a good idea to ask around and get recommendations. You will then need to call the office and see if the doctor you have chosen will accept you as a new  patient and what types of options they offer for patients who are self-pay. Some doctors offer discounts or will set up payment plans for their patients who do not have insurance, but you will need to ask so you aren't surprised when you get to your appointment. ° °2) Contact Your Local Health Department °Not all health departments have doctors that can see patients for sick visits, but many do, so it is worth a call to see if yours does. If you don't know where your local health department is, you can check in your phone book. The CDC also has a tool to help you locate your state's health department, and many state websites also have listings of all of their local health departments. ° °3) Find a Walk-in Clinic °If your illness is not likely to be very severe or complicated, you may want to try a walk in clinic. These are popping up all over the country in pharmacies, drugstores, and shopping centers. They're usually staffed by nurse practitioners or physician assistants that have been trained to treat common illnesses and complaints. They're usually fairly quick and inexpensive. However, if you have serious medical issues or chronic medical problems, these are probably not your best option. ° °No Primary Care Doctor: °- Call [facility] at  832-8000 - they can help you locate a primary care doctor that  accepts your insurance, provides certain services, etc. °- Physician Referral Service- 1-800-533-3463 ° °Chronic Pain Problems: °Organization         Address  Phone   Notes  °[facility]  (336) 297-2271 Patients need to be referred by their primary care doctor.  ° °Medication Assistance: °Organization         Address  Phone   Notes  °[place] [facility] [address] °[place], [place] [address] (336) 641-8030 --Must be a resident of [place] °-- Must have NO insurance coverage whatsoever (no Medicaid/ Medicare, etc.) °-- The pt. MUST have a primary  care doctor that directs their care regularly and follows them in the community °  °[facility]  (866) 331-1348   °[employer]  (888) 892-1162   ° °Agencies that provide inexpensive medical care: °Organization         Address  Phone   Notes  °[facility]  (336) 832-8035   °[facility]    (336) 832-7272   °[facility] [address] °[place], [place] [address] (336) 832-4777   °[facility] [address], °[place] (336) 271-4999   °[facility]    (336) 373-0678   °[facility]    (336) 272-1050   °[facility] ° [address], [place] Phone:  (336) 832-4444, Fax:  (336) 832-4440 Hours of Operation:  9 am - 6 pm, M-F.  Also accepts Medicaid/Medicare and self-pay.  °[facility] ° [address], [place] Phone: (336) 832-3150, Fax: (336) [phone]. Hours of Operation:  8:30 am - 5:30 pm, M-F.  Also accepts Medicaid and self-pay.  °  [facility] [place] [address], [place] Phone: (336) 878-6027   °[facility] [address], [place], [place] (336)723-1848, Ext. 123 Mondays & Thursdays: 7-9 AM.  First 15 patients are seen on a first come, first serve basis. °  ° °Medicaid-accepting [place] Providers: ° °Organization         Address  Phone   Notes  °[facility] [address], [place] (336) 641-2100 Also accepts self-pay patients.  °[facility] [address], [place] ° (336) 856-9996   °[facility] [address], [place] (336) 288-8857   °[facility] [address] [place] [address], [place] (336) 299-7000   °Veita Bland [address], [place]  ° (336) 373-1557 Only accepts [place] Access Medicaid patients after they have their name applied to their card.  ° °Self-Pay (no insurance) in [place]: ° °Organization         Address  Phone   Notes  °Sickle Cell Patients, [facility] [address], [place] (336) 832-1970   °[facility] [address], [place] (336) 832-4400   °[facility] ° [address], [place] (336) 992-4800   °[facility]/Dr. Osei-Bonsu ° [address] [place] [address], [place] or [address], [place] (336) 841-8500 Phone number for both [place] and [place] locations is the same.  °[facility] [address], [place] (336) 299-0000   °[facility] [address] [place] [address], [place] or [address] (336) 852-7530 °(336) 878-2260   °[facility] [address], [place] (336) 350-1642, phone; (336) 294-5005, fax Sees patients 1st and 3rd Saturday of every month.  Must not qualify for public or private insurance (i.e. Medicaid, Medicare, [place] Health Choice, Veterans' Benefits) • Household income should be no more than 200% of the poverty level •The clinic cannot treat you if you are pregnant or think you are pregnant • Sexually transmitted diseases are not treated at the clinic.  ° ° °Dental Care: °Organization         Address  Phone  Notes  °[facility] [address], [place] (336) 641-6152 Accepts children up to age 21 who are enrolled in Medicaid or [place] Health Choice; pregnant women with a Medicaid card; and children who have applied for Medicaid or [place] Health Choice, but were declined, whose parents can pay a reduced fee at time of service.  °[facility] [place]  [address], [place] (336) 641-7733 Accepts children up to age 21 who are enrolled in Medicaid or [place] Health Choice; pregnant women with a Medicaid card; and children who have applied for Medicaid or [place] Health Choice, but were declined, whose parents can  pay a reduced fee at time of service.  °[facility] ° [address], [place] (336) 641-4533 Patients are seen by appointment only. Walk-ins are not accepted. [facility] will see patients 18 years of age and older. °Monday - Tuesday (8am-5pm) °Most Wednesdays (8:30-5pm) °$30 per visit, cash only  °[facility] ° [address], [place] (336) 641-4533 Patients are seen by appointment only. Walk-ins are not accepted. [facility] will see patients 18 years of   age and older. °One Wednesday Evening (Monthly: Volunteer Based).  $30 per visit, cash only  °[facility]  (919) 537-3737 for adults; Children under age 4, call Graduate Pediatric Dentistry at (919) [phone]. Children aged 4-14, please call (919) [phone] to request a pediatric application. ° Dental services are provided in all areas of dental care including fillings, crowns and bridges, complete and partial dentures, implants, gum treatment, root canals, and extractions. Preventive care is also provided. Treatment is provided to both adults and children. °Patients are selected via a lottery and there is often a waiting list. °  °[facility] [address], °[place] ° (336) 763-8833 [email or web address] °  °[facility] [address], [place], [place] (336)723-1848, Ext. 123 Second and Fourth Thursday of each month, opens at 6:30 AM; Clinic ends at 9 AM.  Patients are seen on a first-come first-served basis, and a limited number are seen during each clinic.  ° °[facility] ° [address], [place], [place] (336) 723-7904   Eligibility Requirements °You must have lived in [place], [place], or [place] counties for at least the last three months. °  You cannot be eligible for state or federal sponsored healthcare insurance, including Veterans Administration, Medicaid, or Medicare. °  You generally cannot be eligible for healthcare  insurance through your employer.  °  How to apply: °Eligibility screenings are held every Tuesday and Wednesday afternoon from 1:00 pm until 4:00 pm. You do not need an appointment for the interview!  °[facility] [address], [place], [place] 336-631-2330   °[facility]  336-342-8273   °[facility]  336-703-3100   °[facility]  336-570-6415   ° °Behavioral Health Resources in the Community: °Intensive Outpatient Programs °Organization         Address  Phone  Notes  °[facility] [address], [place], [place] 336-878-6098   °[facility] [address], [place], [place] 336-832-9800   °[facility] [address], [place], [place] ° 336-882-2125   °[facility] [address],  °[place], [place] 1-800-853-5163 or 336-641-4981   °Substance Abuse Resources °Organization         Address  Phone  Notes  °[facility]  336-882-2125   °[facility]  336-784-9470   °[facility]  336-285-9073   °[facility]  336-845-3988   °Residential & Outpatient Substance Abuse Program  1-800-659-3381   °Psychological Services °Organization         Address  Phone  Notes  °[facility]  336- 832-9600   °[facility]  336- 378-7881   °[facility] [address], [place] 1-800-853-5163 or 336-641-4981   ° °Mobile Crisis Teams °Organization         Address  Phone  Notes  °[facility], Mobile Crisis Care Unit  1-877-626-1772   °Assertive °[facility] ° [address] [place], [place] 336-834-9664   °Sharon DeEsch [address] °[place] 336-554-5454   ° °Self-Help/Support Groups °Organization         Address  Phone             Notes  °[facility] - variety of support groups  336- 373-1402 Call for more information  °Narcotics Anonymous (NA),  [facility] [address], °[place]  2   meetings at this location  ° °Residential Treatment Programs °Organization         Address  Phone  Notes  °[facility] [address],    °[place]  1-866-801-8205   °[facility] ° [address], [place], [place] 704-293-8524   °[facility] [address], [place] 336-845-3988 Admissions: 8am-3pm M-F  °[facility] [address]    °[place], [place] 336-841-1104   °[facility] [address], [place], [place] 336-379-7146   °[facility] [address]  °[place], [place] 336-285-9073   °[facility] - Intensive Outpatient [address], [place], [place] 336-852-3033   °[facility] [address]  °[place], [place] 1-877-615-2722 or 336-784-9470   °[facility] [address] [place], [place] 336-227-7417 Accepts Medicaid  °[facility] [address]  °[place] 1-800-659-3381 Substance Abuse/Addiction Treatment  ° °[place] Behavioral Health Resources °Organization         Address  Phone  Notes  °[facility]  (888) 581-9988   °Julie Brannon, PhD [address] [place], [place]   (336) 349-5553 or (336) 951-0000   °[facility]   [address] °[place], [place] (336) 349-4454   °[facility] [address], [place], [place] (336) 342-8316 Insurance/Medicaid/sponsorship through [facility]  °[facility] [address]                                    [place], [place] (336) 342-8316 Therapy/tele-psych/case  °[facility] [address]  ° [place], [place] (336) 349-2233    °Dr. Arfeen  (336) 349-4544   °[facility] [place]  [employer] [place] [facility] 1) [address], [place] °2) [address], [place] °3)  [address], [place] (336) 349-3220 °(336) 342-7768 ° °(336) 342-8140    °[place] [facility] (336) 342-1394 or (336) 342-3537 (After Hours)    ° ° ° °

## 2013-09-24 NOTE — ED Notes (Signed)
nad noted prior to dc. dcinstructions and explained. Pt voiced understanding.

## 2013-09-24 NOTE — ED Provider Notes (Signed)
CSN: [id number]     Arrival date & time 09/24/13  1503 History  This chart was scribed for Raeford Razor, MD by Leone Payor, ED Scribe. This patient was seen in room APA18/APA18 and the patient's care was started 4:24 PM.     Chief Complaint  Patient presents with  . Chest Pain      The history is provided by the patient. No language interpreter was used.    HPI Comments: Mikayla Johnson is a 43 y.o. female who presents to the Emergency Department complaining of 2 days of intermittent, gradually worsening, non-radiating left sided chest pain that lasts for a few minutes at a time. She describes the pain as sharp and shooting. She denies any modifying factors. She has taken ibuprofen and Aleve without relief. She denies similar symptoms in the past. She denies nausea, vomiting, cough, palpitations, SOB, leg swelling, leg pain.   Past Medical History  Diagnosis Date  . Hypertension    Past Surgical History  Procedure Laterality Date  . Finger surgery     No family history on file. History  Substance Use Topics  . Smoking status: Current Every Day Smoker  . Smokeless tobacco: Not on file  . Alcohol Use: Yes     Comment: OCC   OB History   Grav Para Term Preterm Abortions TAB SAB Ect Mult Living                 Review of Systems  Respiratory: Negative for cough and shortness of breath.   Cardiovascular: Positive for chest pain. Negative for palpitations and leg swelling.  Gastrointestinal: Negative for nausea and vomiting.  All other systems reviewed and are negative.      Allergies  Penicillins and Vancomycin  Home Medications   Current Outpatient Rx  Name  Route  Sig  Dispense  Refill  . ibuprofen (ADVIL,MOTRIN) 200 MG tablet   Oral   Take 400 mg by mouth every 6 (six) hours as needed. For headache          . oxyCODONE-acetaminophen (PERCOCET/ROXICET) 5-325 MG per tablet   Oral   Take 1 tablet by mouth every 8 (eight) hours as needed for pain.   10  tablet   0    BP 143/85  Pulse 91  Temp(Src) 98.3 F (36.8 C) (Oral)  Resp 14  SpO2 98%  LMP 09/10/2013 Physical Exam  Nursing note and vitals reviewed. Constitutional: She is oriented to person, place, and time. She appears well-developed and well-nourished. No distress.  HENT:  Head: Normocephalic and atraumatic.  Eyes: EOM are normal.  Neck: Normal range of motion.  Cardiovascular: Normal rate, regular rhythm and normal heart sounds.   Pulmonary/Chest: Effort normal and breath sounds normal. She exhibits no tenderness.  Abdominal: Soft. She exhibits no distension. There is no tenderness.  Musculoskeletal: Normal range of motion.  No calf tenderness or edema.   Neurological: She is alert and oriented to person, place, and time.  Skin: Skin is warm and dry.  Psychiatric: She has a normal mood and affect. Judgment normal.    ED Course  Procedures (including critical care time)  DIAGNOSTIC STUDIES: Oxygen Saturation is 98% on RA, normal by my interpretation.    COORDINATION OF CARE: 4:25 PM Discussed treatment plan with pt at bedside and pt agreed to plan.   Labs Review Labs Reviewed  CBC WITH DIFFERENTIAL - Abnormal; Notable for the following:    Hemoglobin 15.3 (*)    All other  components within normal limits  BASIC METABOLIC PANEL  TROPONIN I   Imaging Review Dg Chest 2 View  09/24/2013   CLINICAL DATA:  Chest pain.  Hypertension, smoker.  EXAM: CHEST  2 VIEW  COMPARISON:  None.  FINDINGS: The heart size and mediastinal contours are within normal limits. Both lungs are clear. The visualized skeletal structures are unremarkable.  IMPRESSION: No active cardiopulmonary disease.   Electronically Signed   By: Charlett NoseKevin  Dover M.D.   On: 09/24/2013 15:51     EKG Interpretation   Date/Time:  Monday September 24 2013 15:29:55 EDT Ventricular Rate:  90 PR Interval:  146 QRS Duration: 70 QT Interval:  342 QTC Calculation: 418 R Axis:   62 Text Interpretation:  Normal sinus  rhythm Normal ECG No previous ECGs  available ED PHYSICIAN INTERPRETATION AVAILABLE IN [facility]  Confirmed by TEST, Record ([id number]) on 09/26/2013 6:54:00 AM      MDM   Final diagnoses:  Chest pain     I personally preformed the services scribed in my presence. The recorded information has been reviewed is accurate. Raeford RazorStephen Alexie Samson, MD.   Raeford RazorStephen Letty Salvi, MD 09/28/13 [phone]

## 2014-06-06 ENCOUNTER — Emergency Department (HOSPITAL_COMMUNITY): Payer: Self-pay

## 2014-06-06 ENCOUNTER — Encounter (HOSPITAL_COMMUNITY): Payer: Self-pay | Admitting: Emergency Medicine

## 2014-06-06 ENCOUNTER — Emergency Department (HOSPITAL_COMMUNITY)
Admission: EM | Admit: 2014-06-06 | Discharge: 2014-06-06 | Disposition: A | Discharge status: Home / Self Care | Payer: Self-pay | Attending: Emergency Medicine | Admitting: Emergency Medicine

## 2014-06-06 DIAGNOSIS — N760 Acute vaginitis: Secondary | ICD-10-CM | POA: Insufficient documentation

## 2014-06-06 DIAGNOSIS — Z720 Tobacco use: Secondary | ICD-10-CM | POA: Insufficient documentation

## 2014-06-06 DIAGNOSIS — N739 Female pelvic inflammatory disease, unspecified: Secondary | ICD-10-CM | POA: Insufficient documentation

## 2014-06-06 DIAGNOSIS — I10 Essential (primary) hypertension: Secondary | ICD-10-CM | POA: Insufficient documentation

## 2014-06-06 DIAGNOSIS — K5900 Constipation, unspecified: Secondary | ICD-10-CM | POA: Insufficient documentation

## 2014-06-06 DIAGNOSIS — R109 Unspecified abdominal pain: Secondary | ICD-10-CM

## 2014-06-06 DIAGNOSIS — R143 Flatulence: Secondary | ICD-10-CM | POA: Insufficient documentation

## 2014-06-06 DIAGNOSIS — B9689 Other specified bacterial agents as the cause of diseases classified elsewhere: Secondary | ICD-10-CM

## 2014-06-06 DIAGNOSIS — Z3202 Encounter for pregnancy test, result negative: Secondary | ICD-10-CM | POA: Insufficient documentation

## 2014-06-06 DIAGNOSIS — N730 Acute parametritis and pelvic cellulitis: Secondary | ICD-10-CM

## 2014-06-06 LAB — COMPREHENSIVE METABOLIC PANEL
ALT: 14 U/L (ref 0–35)
ANION GAP: 13 (ref 5–15)
AST: 17 U/L (ref 0–37)
Albumin: 3.6 g/dL (ref 3.5–5.2)
Alkaline Phosphatase: 72 U/L (ref 39–117)
BILIRUBIN TOTAL: 0.2 mg/dL — AB (ref 0.3–1.2)
BUN: 17 mg/dL (ref 6–23)
CHLORIDE: 104 meq/L (ref 96–112)
CO2: 21 meq/L (ref 19–32)
CREATININE: 0.88 mg/dL (ref 0.50–1.10)
Calcium: 8.7 mg/dL (ref 8.4–10.5)
GFR calc Af Amer: >90 mL/min (ref >90)
GFR, EST NON AFRICAN AMERICAN: 79 mL/min — AB (ref >90)
Glucose, Bld: 123 mg/dL — ABNORMAL HIGH (ref 70–99)
Potassium: 3.7 mEq/L (ref 3.7–5.3)
Sodium: 138 mEq/L (ref 137–147)
Total Protein: 7.3 g/dL (ref 6.0–8.3)

## 2014-06-06 LAB — URINALYSIS, ROUTINE W REFLEX MICROSCOPIC
BILIRUBIN URINE: NEGATIVE
Glucose, UA: NEGATIVE mg/dL
Ketones, ur: NEGATIVE mg/dL
LEUKOCYTES UA: NEGATIVE
Nitrite: NEGATIVE
PROTEIN: NEGATIVE mg/dL
SPECIFIC GRAVITY, URINE: 1.02 (ref 1.005–1.030)
Urobilinogen, UA: 0.2 mg/dL (ref 0.0–1.0)
pH: 6.5 (ref 5.0–8.0)

## 2014-06-06 LAB — CBC WITH DIFFERENTIAL/PLATELET
Basophils Absolute: 0 10*3/uL (ref 0.0–0.1)
Basophils Relative: 0 % (ref 0–1)
Eosinophils Absolute: 0.1 10*3/uL (ref 0.0–0.7)
Eosinophils Relative: 1 % (ref 0–5)
HEMATOCRIT: 42.2 % (ref 36.0–46.0)
HEMOGLOBIN: 14.4 g/dL (ref 12.0–15.0)
LYMPHS PCT: 37 % (ref 12–46)
Lymphs Abs: 3.6 10*3/uL (ref 0.7–4.0)
MCH: 32.6 pg (ref 26.0–34.0)
MCHC: 34.1 g/dL (ref 30.0–36.0)
MCV: 95.5 fL (ref 78.0–100.0)
MONO ABS: 0.5 10*3/uL (ref 0.1–1.0)
MONOS PCT: 5 % (ref 3–12)
NEUTROS ABS: 5.4 10*3/uL (ref 1.7–7.7)
Neutrophils Relative %: 57 % (ref 43–77)
Platelets: 276 10*3/uL (ref 150–400)
RBC: 4.42 MIL/uL (ref 3.87–5.11)
RDW: 13.9 % (ref 11.5–15.5)
WBC: 9.6 10*3/uL (ref 4.0–10.5)

## 2014-06-06 LAB — URINE MICROSCOPIC-ADD ON

## 2014-06-06 LAB — PREGNANCY, URINE: PREG TEST UR: NEGATIVE

## 2014-06-06 LAB — WET PREP, GENITAL
TRICH WET PREP: NONE SEEN
WBC WET PREP: NONE SEEN

## 2014-06-06 MED ORDER — ONDANSETRON HCL 4 MG/2ML IJ SOLN
INTRAMUSCULAR | Status: DC
Start: 2014-06-06 — End: 2014-06-08
  Filled 2014-06-06: qty 2

## 2014-06-06 MED ORDER — LIDOCAINE HCL (PF) 1 % IJ SOLN
INTRAMUSCULAR | Status: AC
  Filled 2014-06-06: qty 5

## 2014-06-06 MED ORDER — FLEET ENEMA 7-19 GM/118ML RE ENEM
1.0000 | ENEMA | Freq: Once | RECTAL | Status: AC
  Administered 2014-06-06: 1 via RECTAL

## 2014-06-06 MED ORDER — METRONIDAZOLE 500 MG PO TABS: 500.0000 mg | ORAL_TABLET | Freq: Two times a day (BID) | ORAL | Status: AC

## 2014-06-06 MED ORDER — NAPROXEN 500 MG PO TABS: 500.0000 mg | ORAL_TABLET | Freq: Two times a day (BID) | ORAL | Status: AC

## 2014-06-06 MED ORDER — CEFTRIAXONE SODIUM 250 MG IJ SOLR
250.0000 mg | Freq: Once | INTRAMUSCULAR | Status: AC
  Administered 2014-06-06: 250 mg via INTRAMUSCULAR

## 2014-06-06 MED ORDER — ONDANSETRON 4 MG PO TBDP
4.0000 mg | ORAL_TABLET | Freq: Once | ORAL | Status: AC
  Administered 2014-06-06: 4 mg via ORAL

## 2014-06-06 MED ORDER — ONDANSETRON HCL 4 MG/2ML IJ SOLN: 4.0000 mg | Freq: Once | INTRAMUSCULAR | Status: DC

## 2014-06-06 MED ORDER — DOXYCYCLINE HYCLATE 100 MG PO CAPS: 100.0000 mg | ORAL_CAPSULE | Freq: Two times a day (BID) | ORAL | Status: AC

## 2014-06-06 MED ORDER — HYDROMORPHONE HCL 1 MG/ML IJ SOLN: 1.0000 mg | Freq: Once | INTRAMUSCULAR | Status: DC

## 2014-06-06 MED ORDER — OXYCODONE-ACETAMINOPHEN 5-325 MG PO TABS
1.0000 | ORAL_TABLET | Freq: Once | ORAL | Status: AC
  Administered 2014-06-06: 1 via ORAL

## 2014-06-06 MED ORDER — AZITHROMYCIN 250 MG PO TABS
ORAL_TABLET | ORAL | Status: AC
  Filled 2014-06-06: qty 4

## 2014-06-06 MED ORDER — AZITHROMYCIN 250 MG PO TABS
1000.0000 mg | ORAL_TABLET | Freq: Once | ORAL | Status: AC
  Administered 2014-06-06: 1000 mg via ORAL

## 2014-06-06 MED ORDER — OXYCODONE-ACETAMINOPHEN 5-325 MG PO TABS
ORAL_TABLET | ORAL | Status: AC
  Filled 2014-06-06: qty 1

## 2014-06-06 MED ORDER — SODIUM CHLORIDE 0.9 % IV SOLN: INTRAVENOUS | Status: DC

## 2014-06-06 MED ORDER — HYDROMORPHONE HCL 1 MG/ML IJ SOLN
INTRAMUSCULAR | Status: AC
  Filled 2014-06-06: qty 1

## 2014-06-06 MED ORDER — CEFTRIAXONE SODIUM 250 MG IJ SOLR
INTRAMUSCULAR | Status: AC
  Filled 2014-06-06: qty 250

## 2014-06-06 MED ORDER — ONDANSETRON 4 MG PO TBDP
ORAL_TABLET | ORAL | Status: AC
  Filled 2014-06-06: qty 1

## 2014-06-06 NOTE — ED Provider Notes (Signed)
CSN: [id number]     Arrival date & time 06/06/14  1831 History   First MD Initiated Contact with Patient 06/06/14 1849     Chief Complaint  Patient presents with  . Abdominal Pain     (Consider location/radiation/quality/duration/timing/severity/associated sxs/prior Treatment) Patient is a 43 y.o. female presenting with abdominal pain. The history is provided by the patient.  Abdominal Pain Pain location:  LLQ, RLQ and suprapubic Pain quality: cramping and sharp   Pain radiates to:  Perineum Onset quality:  Gradual Duration:  1 day Timing:  Constant Progression:  Worsening Chronicity:  New Associated symptoms: constipation and flatus   Associated symptoms: no chest pain, no chills, no cough, no dysuria, no fever, no nausea, no shortness of breath, no vaginal bleeding, no vaginal discharge and no vomiting    Herbert Moorseressa R Rozycki is a 43 y.o. female who presents to the ED with constipation x 3 days and abdominal pain x 1 day. The pain radiates from the lower abdomen to the vaginal area. Patient states she will feel like she needs to have a BM but strains and can not get it out.   Past Medical History  Diagnosis Date  . Hypertension    Past Surgical History  Procedure Laterality Date  . Finger surgery     No family history on file. History  Substance Use Topics  . Smoking status: Current Every Day Smoker -- 0.50 packs/day    Types: Cigarettes  . Smokeless tobacco: Not on file  . Alcohol Use: Yes     Comment: OCC   OB History    No data available     Review of Systems  Constitutional: Negative for fever and chills.  HENT: Negative.   Eyes: Negative for pain, itching and visual disturbance.  Respiratory: Negative for cough, shortness of breath and wheezing.   Cardiovascular: Negative for chest pain.  Gastrointestinal: Positive for abdominal pain, constipation and flatus. Negative for nausea and vomiting.  Genitourinary: Negative for dysuria, urgency, frequency, vaginal  bleeding and vaginal discharge.  Musculoskeletal: Negative for myalgias and back pain.  Skin: Negative for rash.  Neurological: Negative for syncope, light-headedness and headaches.  Psychiatric/Behavioral: Negative for confusion. The patient is not nervous/anxious.       Allergies  Penicillins and Vancomycin  Home Medications   Prior to Admission medications   Medication Sig Start Date End Date Taking? Authorizing Provider  naproxen sodium (ALEVE) 220 MG tablet Take 220-440 mg by mouth daily as needed (for pain).   Yes Historical Provider, MD   BP 138/85 mmHg  Pulse 80  Temp(Src) 98.3 F (36.8 C) (Oral)  Resp 20  Ht 5\' 4"  (1.626 m)  Wt 180 lb (81.647 kg)  BMI 30.88 kg/m2  SpO2 100%  LMP 02/12/2014 Physical Exam  Constitutional: She is oriented to person, place, and time. She appears well-developed and well-nourished.  HENT:  Head: Normocephalic.  Eyes: EOM are normal.  Neck: Neck supple.  Cardiovascular: Normal rate.   Pulmonary/Chest: Effort normal.  Abdominal: Soft. There is tenderness in the right lower quadrant, suprapubic area and left lower quadrant. There is no rebound, no guarding and no CVA tenderness.  Increased pain LLQ  Genitourinary: Rectal exam shows no external hemorrhoid and anal tone normal. Guaiac negative stool.  External genitalia without lesions, white discharge vaginal vault. Positive CMT, bilateral adnexal tenderness left >right. Uterus without palpable enlargement.    Large amount of hard stool palpated on rectal exam.   Musculoskeletal: Normal range of motion.  Neurological: She is alert and oriented to person, place, and time. No cranial nerve deficit.  Skin: Skin is warm and dry.  Psychiatric: She has a normal mood and affect. Her behavior is normal.  Nursing note and vitals reviewed.   ED Course  Procedures (including critical care time) Labs Review Results for orders placed or performed during the hospital encounter of 06/06/14 (from  the past 24 hour(s))  Urinalysis, Routine w reflex microscopic     Status: Abnormal   Collection Time: 06/06/14  7:14 PM  Result Value Ref Range   Color, Urine YELLOW YELLOW   APPearance HAZY (A) CLEAR   Specific Gravity, Urine 1.020 1.005 - 1.030   pH 6.5 5.0 - 8.0   Glucose, UA NEGATIVE NEGATIVE mg/dL   Hgb urine dipstick SMALL (A) NEGATIVE   Bilirubin Urine NEGATIVE NEGATIVE   Ketones, ur NEGATIVE NEGATIVE mg/dL   Protein, ur NEGATIVE NEGATIVE mg/dL   Urobilinogen, UA 0.2 0.0 - 1.0 mg/dL   Nitrite NEGATIVE NEGATIVE   Leukocytes, UA NEGATIVE NEGATIVE  Urine microscopic-add on     Status: Abnormal   Collection Time: 06/06/14  7:14 PM  Result Value Ref Range   Squamous Epithelial / LPF FEW (A) RARE   WBC, UA 3-6 <3 WBC/hpf   RBC / HPF 3-6 <3 RBC/hpf   Bacteria, UA MANY (A) RARE  CBC with Differential     Status: None   Collection Time: 06/06/14  7:22 PM  Result Value Ref Range   WBC 9.6 4.0 - 10.5 K/uL   RBC 4.42 3.87 - 5.11 MIL/uL   Hemoglobin 14.4 12.0 - 15.0 g/dL   HCT 16.142.2 09.636.0 - 04.546.0 %   MCV 95.5 78.0 - 100.0 fL   MCH 32.6 26.0 - 34.0 pg   MCHC 34.1 30.0 - 36.0 g/dL   RDW 40.913.9 81.111.5 - 91.415.5 %   Platelets 276 150 - 400 K/uL   Neutrophils Relative % 57 43 - 77 %   Neutro Abs 5.4 1.7 - 7.7 K/uL   Lymphocytes Relative 37 12 - 46 %   Lymphs Abs 3.6 0.7 - 4.0 K/uL   Monocytes Relative 5 3 - 12 %   Monocytes Absolute 0.5 0.1 - 1.0 K/uL   Eosinophils Relative 1 0 - 5 %   Eosinophils Absolute 0.1 0.0 - 0.7 K/uL   Basophils Relative 0 0 - 1 %   Basophils Absolute 0.0 0.0 - 0.1 K/uL  Comprehensive metabolic panel     Status: Abnormal   Collection Time: 06/06/14  7:22 PM  Result Value Ref Range   Sodium 138 137 - 147 mEq/L   Potassium 3.7 3.7 - 5.3 mEq/L   Chloride 104 96 - 112 mEq/L   CO2 21 19 - 32 mEq/L   Glucose, Bld 123 (H) 70 - 99 mg/dL   BUN 17 6 - 23 mg/dL   Creatinine, Ser 7.820.88 0.50 - 1.10 mg/dL   Calcium 8.7 8.4 - 95.610.5 mg/dL   Total Protein 7.3 6.0 - 8.3 g/dL    Albumin 3.6 3.5 - 5.2 g/dL   AST 17 0 - 37 U/L   ALT 14 0 - 35 U/L   Alkaline Phosphatase 72 39 - 117 U/L   Total Bilirubin 0.2 (L) 0.3 - 1.2 mg/dL   GFR calc non Af Amer 79 (L) >90 mL/min   GFR calc Af Amer >90 >90 mL/min   Anion gap 13 5 - 15  Pregnancy, urine     Status: None  Collection Time: 06/06/14  7:55 PM  Result Value Ref Range   Preg Test, Ur NEGATIVE NEGATIVE  Wet prep, genital     Status: Abnormal   Collection Time: 06/06/14  7:57 PM  Result Value Ref Range   Yeast Wet Prep HPF POC FEW (A) NONE SEEN   Trich, Wet Prep NONE SEEN NONE SEEN   Clue Cells Wet Prep HPF POC MODERATE (A) NONE SEEN   WBC, Wet Prep HPF POC NONE SEEN NONE SEEN    Ct Renal Stone Study  06/06/2014   CLINICAL DATA:  Acute lower abdominal/ pelvic pain. Initial encounter.  EXAM: CT ABDOMEN AND PELVIS WITHOUT CONTRAST  TECHNIQUE: Multidetector CT imaging of the abdomen and pelvis was performed following the standard protocol without IV contrast.  COMPARISON:  None.  FINDINGS: The liver, gallbladder, spleen, pancreas, adrenal glands and kidneys are unremarkable.  Please note that parenchymal abnormalities may be missed without intravenous contrast.  There is no evidence of free fluid, biliary dilation or abdominal aortic aneurysm.  The bowel, bladder and appendix are unremarkable. There is no evidence of bowel obstruction, abscess or pneumoperitoneum.  A 1.1 x 2 cm soft tissue structure in the left presacral region (image 57) is indeterminate but may represent a lymph node.  An enlarged fluid filled tubular structure in the right adnexal region is compatible with fluid in a dilated fallopian tube and may represent hydrosalpinx or pyosalpinx -correlate clinically.  No acute or suspicious bony abnormalities are identified.  IMPRESSION: Right hydrosalpinx or pyosalpinx.  Correlate clinically.  1.1 x 2 cm soft tissue structure in the left presacral region, indeterminate but may represent a lymph node. Recommend CT  follow-up of the pelvis in 3 months to ensure stability.   Electronically Signed   By: Laveda Abbe M.D.   On: 06/06/2014 22:43    MDM  43 y.o. female with lower abdominal pain x 3 days. Will treat for PID and have her follow up with her PCP. I have reviewed this patient's vital signs, nurses notes, appropriate labs and imaging. I have discussed findings and plan of care with the patient and she voices understanding and agrees with plan.   Rocephin 250 mg IM, Zithromax 1 gram PO, follow up with Dr. Emelda Fear in 2 weeks.    Medication List    STOP taking these medications        ALEVE 220 MG tablet  Generic drug:  naproxen sodium      TAKE these medications        doxycycline 100 MG capsule  Commonly known as:  VIBRAMYCIN  Take 1 capsule (100 mg total) by mouth 2 (two) times daily.     metroNIDAZOLE 500 MG tablet  Commonly known as:  FLAGYL  Take 1 tablet (500 mg total) by mouth 2 (two) times daily.     naproxen 500 MG tablet  Commonly known as:  NAPROSYN  Take 1 tablet (500 mg total) by mouth 2 (two) times daily.           [address] [place], NP 06/09/14 Prentice Docker  Rolland Porter, MD 06/16/14 [phone]

## 2014-06-06 NOTE — ED Notes (Addendum)
PT c/o lower cramping abdominal pain today with constipation x3 days. PT denies any n/v/d. PT denies any urinary symptoms.

## 2014-06-06 NOTE — Discharge Instructions (Signed)
Call Dr. Rayna SextonFerguson's office for a follow up appointment. Return here as needed for worsening symptoms.

## 2014-06-07 LAB — POC OCCULT BLOOD, ED: FECAL OCCULT BLD: NEGATIVE

## 2014-06-08 LAB — GC/CHLAMYDIA PROBE AMP
CT Probe RNA: NEGATIVE
GC Probe RNA: NEGATIVE

## 2015-11-08 ENCOUNTER — Emergency Department (HOSPITAL_COMMUNITY): Payer: Self-pay

## 2015-11-08 ENCOUNTER — Encounter (HOSPITAL_COMMUNITY): Payer: Self-pay | Admitting: Emergency Medicine

## 2015-11-08 ENCOUNTER — Emergency Department (HOSPITAL_COMMUNITY)
Admission: EM | Admit: 2015-11-08 | Discharge: 2015-11-08 | Disposition: A | Discharge status: Home / Self Care | Payer: Self-pay | Attending: Emergency Medicine | Admitting: Emergency Medicine

## 2015-11-08 DIAGNOSIS — W208XXA Other cause of strike by thrown, projected or falling object, initial encounter: Secondary | ICD-10-CM | POA: Insufficient documentation

## 2015-11-08 DIAGNOSIS — S90121A Contusion of right lesser toe(s) without damage to nail, initial encounter: Secondary | ICD-10-CM

## 2015-11-08 DIAGNOSIS — Z791 Long term (current) use of non-steroidal anti-inflammatories (NSAID): Secondary | ICD-10-CM | POA: Insufficient documentation

## 2015-11-08 DIAGNOSIS — F17210 Nicotine dependence, cigarettes, uncomplicated: Secondary | ICD-10-CM | POA: Insufficient documentation

## 2015-11-08 DIAGNOSIS — I10 Essential (primary) hypertension: Secondary | ICD-10-CM | POA: Insufficient documentation

## 2015-11-08 DIAGNOSIS — L603 Nail dystrophy: Secondary | ICD-10-CM | POA: Insufficient documentation

## 2015-11-08 DIAGNOSIS — Y999 Unspecified external cause status: Secondary | ICD-10-CM | POA: Insufficient documentation

## 2015-11-08 DIAGNOSIS — Z792 Long term (current) use of antibiotics: Secondary | ICD-10-CM | POA: Insufficient documentation

## 2015-11-08 DIAGNOSIS — Y939 Activity, unspecified: Secondary | ICD-10-CM | POA: Insufficient documentation

## 2015-11-08 DIAGNOSIS — S90111A Contusion of right great toe without damage to nail, initial encounter: Secondary | ICD-10-CM | POA: Insufficient documentation

## 2015-11-08 DIAGNOSIS — Y929 Unspecified place or not applicable: Secondary | ICD-10-CM | POA: Insufficient documentation

## 2015-11-08 MED ORDER — HYDROCODONE-ACETAMINOPHEN 5-325 MG PO TABS: 2.0000 | ORAL_TABLET | ORAL | Status: AC | PRN

## 2015-11-08 MED ORDER — IBUPROFEN 600 MG PO TABS: 600.0000 mg | ORAL_TABLET | Freq: Four times a day (QID) | ORAL | Status: AC | PRN

## 2015-11-08 MED ORDER — IBUPROFEN 400 MG PO TABS: 600.0000 mg | ORAL_TABLET | Freq: Once | ORAL | Status: DC

## 2015-11-08 NOTE — ED Notes (Signed)
Pt states a wooden table fell on her right great toe last night.

## 2015-11-08 NOTE — ED Notes (Signed)
Pt verbalized understanding of no driving and to use caution within 4 hours of taking pain meds due to meds cause drowsiness 

## 2015-11-08 NOTE — ED Provider Notes (Signed)
CSN: [id number]     Arrival date & time 11/08/15  1140 History   First MD Initiated Contact with Patient 11/08/15 1201     Chief Complaint  Patient presents with  . Toe Pain     (Consider location/radiation/quality/duration/timing/severity/associated sxs/prior Treatment) Patient is a 45 y.o. female presenting with toe pain. The history is provided by the patient. No language interpreter was used.  Toe Pain This is a new problem. The current episode started yesterday. The problem occurs constantly. The problem has been unchanged. Associated symptoms include joint swelling. Nothing aggravates the symptoms. She has tried nothing for the symptoms. The treatment provided moderate relief.   Pt reports she dropped a tv table on her foot.   Past Medical History  Diagnosis Date  . Hypertension    Past Surgical History  Procedure Laterality Date  . Finger surgery     History reviewed. No pertinent family history. Social History  Substance Use Topics  . Smoking status: Current Every Day Smoker -- 0.50 packs/day    Types: Cigarettes  . Smokeless tobacco: None  . Alcohol Use: Yes     Comment: OCC   OB History    No data available     Review of Systems  Musculoskeletal: Positive for joint swelling.  All other systems reviewed and are negative.     Allergies  Penicillins and Vancomycin  Home Medications   Prior to Admission medications   Medication Sig Start Date End Date Taking? Authorizing Provider  doxycycline (VIBRAMYCIN) 100 MG capsule Take 1 capsule (100 mg total) by mouth 2 (two) times daily. 06/06/14   Hope Orlene OchM Neese, NP  metroNIDAZOLE (FLAGYL) 500 MG tablet Take 1 tablet (500 mg total) by mouth 2 (two) times daily. 06/06/14   Hope Orlene OchM Neese, NP  naproxen (NAPROSYN) 500 MG tablet Take 1 tablet (500 mg total) by mouth 2 (two) times daily. 06/06/14   Hope Orlene OchM Neese, NP   BP 132/88 mmHg  Pulse 102  Temp(Src) 98.4 F (36.9 C) (Temporal)  Resp 16  Ht 5\' 3"  (1.6 m)  Wt 79.379 kg   BMI 31.01 kg/m2  SpO2 100%  LMP 10/28/2015 Physical Exam  Constitutional: She is oriented to person, place, and time. She appears well-developed and well-nourished.  HENT:  Head: Normocephalic.  Eyes: Conjunctivae and EOM are normal. Pupils are equal, round, and reactive to light.  Abdominal: She exhibits no distension.  Musculoskeletal: She exhibits tenderness.  Tender right 1st toe,  Nail discolored normal cap refill,  Nail is thick,  (pt had previous injury)  nv intact  Neurological: She is alert and oriented to person, place, and time.  Skin: Skin is warm.  Psychiatric: She has a normal mood and affect.  Nursing note and vitals reviewed.   ED Course  Procedures (including critical care time) Labs Review Labs Reviewed - No data to display  Imaging Review Dg Toe Great Right  11/08/2015  CLINICAL DATA:  Table fell on right great toe last night. EXAM: RIGHT GREAT TOE COMPARISON:  08/26/2010 FINDINGS: Negative for a fracture or dislocation. Alignment of the great toe is normal. Soft tissues are unremarkable. IMPRESSION: No acute abnormality. Electronically Signed   By: Richarda OverlieAdam  Henn M.D.   On: 11/08/2015 12:12   I have personally reviewed and evaluated these images and lab results as part of my medical decision-making.   EKG Interpretation None      MDM    Final diagnoses:  Contusion, toe, right, initial encounter  Dystrophic nail  Meds ordered this encounter  Medications  . ibuprofen (ADVIL,MOTRIN) tablet 600 mg    Sig:   . HYDROcodone-acetaminophen (NORCO/VICODIN) 5-325 MG tablet    Sig: Take 2 tablets by mouth every 4 (four) hours as needed.    Dispense:  10 tablet    Refill:  0    Order Specific Question:  Supervising Provider    Answer:  Hyacinth Meeker, BRIAN [[id number]]  . ibuprofen (ADVIL,MOTRIN) 600 MG tablet    Sig: Take 1 tablet (600 mg total) by mouth every 6 (six) hours as needed.    Dispense:  12 tablet    Refill:  0    Order Specific Question:  Supervising  Provider    Answer:  Eber Hong [[id number]]  An After Visit Summary was printed and given to the patient.    Lonia Skinner Dahlgren, PA-C 11/08/15 1420  Loren Racer, MD 11/12/15 1710

## 2015-11-08 NOTE — Discharge Instructions (Signed)

## 2021-03-16 ENCOUNTER — Encounter (HOSPITAL_COMMUNITY): Payer: Self-pay

## 2021-03-16 ENCOUNTER — Emergency Department (HOSPITAL_COMMUNITY)
Admission: EM | Admit: 2021-03-16 | Discharge: 2021-03-16 | Disposition: A | Discharge status: Home / Self Care | Payer: Self-pay | Attending: Emergency Medicine | Admitting: Emergency Medicine

## 2021-03-16 ENCOUNTER — Other Ambulatory Visit: Payer: Self-pay

## 2021-03-16 ENCOUNTER — Emergency Department (HOSPITAL_COMMUNITY): Payer: Self-pay

## 2021-03-16 DIAGNOSIS — I10 Essential (primary) hypertension: Secondary | ICD-10-CM | POA: Insufficient documentation

## 2021-03-16 DIAGNOSIS — F17210 Nicotine dependence, cigarettes, uncomplicated: Secondary | ICD-10-CM | POA: Insufficient documentation

## 2021-03-16 DIAGNOSIS — M25511 Pain in right shoulder: Secondary | ICD-10-CM | POA: Insufficient documentation

## 2021-03-16 DIAGNOSIS — X500XXA Overexertion from strenuous movement or load, initial encounter: Secondary | ICD-10-CM | POA: Insufficient documentation

## 2021-03-16 DIAGNOSIS — Y990 Civilian activity done for income or pay: Secondary | ICD-10-CM | POA: Insufficient documentation

## 2021-03-16 MED ORDER — CYCLOBENZAPRINE HCL 10 MG PO TABS: 10.0000 mg | ORAL_TABLET | Freq: Two times a day (BID) | ORAL | 0 refills | Status: AC | PRN

## 2021-03-16 NOTE — ED Provider Notes (Signed)
[facility] EMERGENCY DEPARTMENT Provider Note   CSN: [id number] Arrival date & time: 03/16/21  1537     History Chief Complaint  Patient presents with   Shoulder Pain    Mikayla Johnson is a 50 y.o. female.  HPI  Patient with no significant medical history presents to the emergency department with chief complaint of right shoulder pain.  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  She states she feels as if she strained a muscle in her shoulder.  The pain is in the top aspect of her deltoid, does not radiate, not associated with paresthesia or weakness in her arm, states still able to move her fingers wrist and elbow without difficulty.  She states she is taking some Tylenol without significant relief.  She has no other complaints at this time.  She denies history of IV drug use, no associated fevers or chills, denies chest pain, shortness of breath, worsening pedal edema.  Past Medical History:  Diagnosis Date   Hypertension     There are no problems to display for this patient.   Past Surgical History:  Procedure Laterality Date   FINGER SURGERY       OB History   No obstetric history on file.     No family history on file.  Social History   Tobacco Use   Smoking status: Every Day    Packs/day: 0.50    Types: Cigarettes   Smokeless tobacco: Never  Substance Use Topics   Alcohol use: Yes    Comment: OCC   Drug use: No    Home Medications Prior to Admission medications   Medication Sig Start Date End Date Taking? Authorizing Provider  cyclobenzaprine (FLEXERIL) 10 MG tablet Take 1 tablet (10 mg total) by mouth 2 (two) times daily as needed for muscle spasms. 03/16/21  Yes Carroll Sage, PA-C  doxycycline (VIBRAMYCIN) 100 MG capsule Take 1 capsule (100 mg total) by mouth 2 (two) times daily. 06/06/14   Janne Napoleon, NP  HYDROcodone-acetaminophen (NORCO/VICODIN) 5-325 MG tablet Take 2 tablets by mouth every 4  (four) hours as needed. 11/08/15   Elson Areas, PA-C  ibuprofen (ADVIL,MOTRIN) 600 MG tablet Take 1 tablet (600 mg total) by mouth every 6 (six) hours as needed. 11/08/15   Elson Areas, PA-C  metroNIDAZOLE (FLAGYL) 500 MG tablet Take 1 tablet (500 mg total) by mouth 2 (two) times daily. 06/06/14   Janne Napoleon, NP  naproxen (NAPROSYN) 500 MG tablet Take 1 tablet (500 mg total) by mouth 2 (two) times daily. 06/06/14   Janne Napoleon, NP    Allergies    Penicillins and Vancomycin  Review of Systems   Review of Systems  Constitutional:  Negative for chills and fever.  HENT:  Negative for congestion.   Respiratory:  Negative for shortness of breath.   Cardiovascular:  Negative for chest pain.  Gastrointestinal:  Negative for abdominal pain.  Genitourinary:  Negative for enuresis.  Musculoskeletal:  Negative for back pain.       Right shoulder pain.  Skin:  Negative for rash.  Neurological:  Negative for dizziness.  Hematological:  Does not bruise/bleed easily.   Physical Exam Updated Vital Signs BP (!) 155/102 (BP Location: Left Arm)   Pulse 93   Temp 98.5 F (36.9 C) (Oral)   Resp 16   Ht 5\' 3"  (1.6 m)   Wt 66.7 kg   LMP 10/28/2015   SpO2  100%   BMI 26.04 kg/m   Physical Exam Vitals and nursing note reviewed.  Constitutional:      General: She is not in acute distress.    Appearance: She is not ill-appearing.  HENT:     Head: Normocephalic and atraumatic.     Nose: No congestion.  Eyes:     Conjunctiva/sclera: Conjunctivae normal.  Cardiovascular:     Rate and Rhythm: Normal rate and regular rhythm.     Pulses: Normal pulses.     Heart sounds: No murmur heard.   No friction rub. No gallop.  Pulmonary:     Effort: No respiratory distress.     Breath sounds: No wheezing, rhonchi or rales.  Musculoskeletal:     Comments: Right shoulder was visualized no gross deformities present, no erythema or edema present, patient was tender to palpation along the anterior  aspect of her deltoid, no gross deformities noted no crepitus present.  She has full range of motion in her fingers wrist and elbow, neurovascular fully intact.  Patient is 5 of 5 grip strength.  Skin:    General: Skin is warm and dry.  Neurological:     Mental Status: She is alert.  Psychiatric:        Mood and Affect: Mood normal.    ED Results / Procedures / Treatments   Labs (all labs ordered are listed, but only abnormal results are displayed) Labs Reviewed - No data to display  EKG None  Radiology DG Shoulder Right  Result Date: 03/16/2021 CLINICAL DATA:  Right shoulder pain since yesterday, lifting injury EXAM: RIGHT SHOULDER - 2+ VIEW COMPARISON:  10/31/2011 FINDINGS: Frontal, transscapular, and axillary views of the right shoulder are obtained. No fracture, subluxation, or dislocation. 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 Soft tissues are unremarkable. Right chest is clear. IMPRESSION: 1. Moderate osteoarthritis.  No acute fracture. Electronically Signed   By: Sharlet Salina M.D.   On: 03/16/2021 17:39    Procedures Procedures   Medications Ordered in ED Medications - No data to display  ED Course  I have reviewed the triage vital signs and the nursing notes.  Pertinent labs & imaging results that were available during my care of the patient were reviewed by me and considered in my medical decision making (see chart for details).    MDM Rules/Calculators/A&P                          Initial impression-patient presents with right shoulder pain.  She is alert, does not appear in acute stress, vital signs reassuring.  Triage obtained imaging.  Work-up-DG of right shoulder is negative for acute findings.  Rule out-I have low suspicion for septic arthritis as patient denies IV drug use, skin exam was performed no erythematous, edematous, warm joints noted on exam, no new heart murmur heard on exam.  Low suspicion for fracture or  dislocation as x-ray does not feel any significant findings. low suspicion for ligament or tendon damage as area was palpated no gross defects noted. Patient does have decreased range of motion her right shoulder but I suspect this is likely due to pain.  Low suspicion for compartment syndrome as area was palpated it was soft to the touch, neurovascular fully intact.   Plan-  Right shoulder pain- I suspect this is a muscular strain, will recommend over-the-counter pain medications, provide with muscle relaxers, follow-up with PCP for further evaluation.  Vital signs have remained  stable, no indication for hospital admission.  Patient given at home care as well strict return precautions.  Patient verbalized that they understood agreed to said plan.  Final Clinical Impression(s) / ED Diagnoses Final diagnoses:  Acute pain of right shoulder    Rx / DC Orders ED Discharge Orders          Ordered    cyclobenzaprine (FLEXERIL) 10 MG tablet  2 times daily PRN        03/16/21 2219             Carroll Sage, PA-C 03/16/21 2221    Derwood Kaplan, MD 03/17/21 1138

## 2021-03-16 NOTE — ED Triage Notes (Signed)
Pt presents to ED with complaints of right shoulder pain started yesterday. 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

## 2021-03-16 NOTE — Discharge Instructions (Signed)
You have been seen here for right shoulder pain. I recommend taking over-the-counter pain medications like ibuprofen and/or Tylenol every 6 as needed.  Please follow dosage and on the back of bottle.  I also recommend applying heat to the area and stretching out the muscles as this will help decrease stiffness and pain. I have also given you a prescription for a muscle relaxer this can make you drowsy do not consume alcohol or operate heavy machinery when taking this medication.   Please follow-up with your primary care provider and/or orthopedic surgery for further evaluation.  Come back to the emergency department if you develop chest pain, shortness of breath, severe abdominal pain, uncontrolled nausea, vomiting, diarrhea.

## 2021-07-04 ENCOUNTER — Encounter (HOSPITAL_COMMUNITY): Payer: Self-pay | Admitting: *Deleted

## 2021-07-04 ENCOUNTER — Emergency Department (HOSPITAL_COMMUNITY)
Admission: EM | Admit: 2021-07-04 | Discharge: 2021-07-04 | Disposition: A | Discharge status: Home / Self Care | Payer: Self-pay | Attending: Emergency Medicine | Admitting: Emergency Medicine

## 2021-07-04 ENCOUNTER — Other Ambulatory Visit: Payer: Self-pay

## 2021-07-04 DIAGNOSIS — R1110 Vomiting, unspecified: Secondary | ICD-10-CM | POA: Insufficient documentation

## 2021-07-04 DIAGNOSIS — I10 Essential (primary) hypertension: Secondary | ICD-10-CM | POA: Insufficient documentation

## 2021-07-04 DIAGNOSIS — F17210 Nicotine dependence, cigarettes, uncomplicated: Secondary | ICD-10-CM | POA: Insufficient documentation

## 2021-07-04 DIAGNOSIS — R197 Diarrhea, unspecified: Secondary | ICD-10-CM | POA: Insufficient documentation

## 2021-07-04 MED ORDER — ONDANSETRON 4 MG PO TBDP: 4.0000 mg | ORAL_TABLET | Freq: Once | ORAL | Status: DC | PRN

## 2021-07-04 NOTE — ED Provider Notes (Signed)
[facility] EMERGENCY DEPARTMENT Provider Note   CSN: [id number] Arrival date & time: 07/04/21  1603     History Chief Complaint  Patient presents with   Emesis    Mikayla Johnson is a 50 y.o. female.  The history is provided by the patient. No language interpreter was used.  Diarrhea Quality:  Watery Severity:  Moderate Onset quality:  Gradual Number of episodes:  Multiple Duration:  3 days Timing:  Intermittent Progression:  Improving Relieved by:  Nothing Worsened by:  Nothing Ineffective treatments:  None tried Associated symptoms: no abdominal pain, no recent cough and no fever   Risk factors: sick contacts   Pt reports she had diarrhea after being exposed to a pt at her job that had diarrhea.  Pt reports she is feeling better.  Pt states she needs a note to return to work.      Past Medical History:  Diagnosis Date   Hypertension     There are no problems to display for this patient.   Past Surgical History:  Procedure Laterality Date   FINGER SURGERY       OB History   No obstetric history on file.     History reviewed. No pertinent family history.  Social History   Tobacco Use   Smoking status: Every Day    Packs/day: 0.50    Types: Cigarettes   Smokeless tobacco: Never  Substance Use Topics   Alcohol use: Yes    Comment: OCC   Drug use: No    Home Medications Prior to Admission medications   Medication Sig Start Date End Date Taking? Authorizing Provider  cyclobenzaprine (FLEXERIL) 10 MG tablet Take 1 tablet (10 mg total) by mouth 2 (two) times daily as needed for muscle spasms. 03/16/21   Carroll Sage, PA-C  doxycycline (VIBRAMYCIN) 100 MG capsule Take 1 capsule (100 mg total) by mouth 2 (two) times daily. 06/06/14   Janne Napoleon, NP  HYDROcodone-acetaminophen (NORCO/VICODIN) 5-325 MG tablet Take 2 tablets by mouth every 4 (four) hours as needed. 11/08/15   Elson Areas, PA-C  ibuprofen (ADVIL,MOTRIN) 600 MG tablet Take 1  tablet (600 mg total) by mouth every 6 (six) hours as needed. 11/08/15   Elson Areas, PA-C  metroNIDAZOLE (FLAGYL) 500 MG tablet Take 1 tablet (500 mg total) by mouth 2 (two) times daily. 06/06/14   Janne Napoleon, NP  naproxen (NAPROSYN) 500 MG tablet Take 1 tablet (500 mg total) by mouth 2 (two) times daily. 06/06/14   Janne Napoleon, NP    Allergies    Penicillins and Vancomycin  Review of Systems   Review of Systems  Constitutional:  Negative for fever.  Gastrointestinal:  Positive for diarrhea. Negative for abdominal pain.  All other systems reviewed and are negative.  Physical Exam Updated Vital Signs BP (!) 172/110 (BP Location: Right Arm)    Pulse 91    Temp 97.9 F (36.6 C) (Oral)    Resp 15    Ht 5\' 3"  (1.6 m)    Wt 66.7 kg    LMP 10/28/2015    SpO2 100%    BMI 26.04 kg/m   Physical Exam Vitals reviewed.  Constitutional:      Appearance: Normal appearance.  Eyes:     Extraocular Movements: Extraocular movements intact.     Pupils: Pupils are equal, round, and reactive to light.  Cardiovascular:     Rate and Rhythm: Normal rate and regular rhythm.  Pulses: Normal pulses.  Pulmonary:     Effort: Pulmonary effort is normal.     Breath sounds: Normal breath sounds.  Abdominal:     General: Abdomen is flat.  Musculoskeletal:        General: Normal range of motion.  Skin:    General: Skin is warm.  Neurological:     General: No focal deficit present.     Mental Status: She is alert.  Psychiatric:        Mood and Affect: Mood normal.    ED Results / Procedures / Treatments   Labs (all labs ordered are listed, but only abnormal results are displayed) Labs Reviewed - No data to display  EKG None  Radiology No results found.  Procedures Procedures   Medications Ordered in ED Medications - No data to display  ED Course  I have reviewed the triage vital signs and the nursing notes.  Pertinent labs & imaging results that were available during my care of  the patient were reviewed by me and considered in my medical decision making (see chart for details).    MDM Rules/Calculators/A&P                         This patient presents to the ED for concern of diarrhea, this involves an extensive number of treatment options, and is a complaint that carries with it a high risk of complications and morbidity.  The differential diagnosis includes viral illness, infectious diarrhea,    Co morbidities that complicate the patient evaluation  Hypertension    Dispostion:  After consideration of the diagnostic results and the patients response to treatment, I feel that the patent would benefit from continued oral fluids       Final Clinical Impression(s) / ED Diagnoses Final diagnoses:  Diarrhea, unspecified type    Rx / DC Orders ED Discharge Orders     None     An After Visit Summary was printed and given to the patient.    Elson Areas, [place] 07/04/21 1753    Eber Hong, MD 07/09/21 1444

## 2021-07-04 NOTE — ED Triage Notes (Signed)
Pt with emesis and diarrhea since Thursday, slowed down today per pt.  Pt states work sent pt here for a work note.
